# Patient Record
Sex: Female | Born: 1955 | Race: White | Hispanic: No | Marital: Single | Smoking: Current every day smoker
Health system: Southern US, Community
[De-identification: ages and names within clinical notes are randomized; demographics above are authoritative.]

## PROBLEM LIST (undated history)

## (undated) DIAGNOSIS — E039 Hypothyroidism, unspecified: Secondary | ICD-10-CM

## (undated) DIAGNOSIS — M1990 Unspecified osteoarthritis, unspecified site: Secondary | ICD-10-CM

## (undated) DIAGNOSIS — I10 Essential (primary) hypertension: Secondary | ICD-10-CM

## (undated) DIAGNOSIS — K219 Gastro-esophageal reflux disease without esophagitis: Secondary | ICD-10-CM

## (undated) DIAGNOSIS — E119 Type 2 diabetes mellitus without complications: Secondary | ICD-10-CM

## (undated) HISTORY — PX: ABDOMINAL HYSTERECTOMY: SHX81

## (undated) HISTORY — PX: APPENDECTOMY: SHX54

---

## 2007-06-18 ENCOUNTER — Ambulatory Visit: Payer: Self-pay | Admitting: Emergency Medicine

## 2007-08-27 ENCOUNTER — Ambulatory Visit: Payer: Self-pay | Admitting: Family Medicine

## 2008-06-29 ENCOUNTER — Ambulatory Visit: Payer: Self-pay

## 2009-07-26 ENCOUNTER — Ambulatory Visit: Payer: Self-pay | Admitting: Internal Medicine

## 2010-12-12 ENCOUNTER — Ambulatory Visit: Payer: Self-pay | Admitting: Obstetrics and Gynecology

## 2010-12-13 ENCOUNTER — Emergency Department: Payer: Self-pay | Admitting: Emergency Medicine

## 2010-12-26 ENCOUNTER — Ambulatory Visit: Payer: Self-pay | Admitting: Obstetrics and Gynecology

## 2010-12-26 ENCOUNTER — Ambulatory Visit: Payer: Self-pay | Admitting: Internal Medicine

## 2010-12-26 IMAGING — MG MAM DGTL ADD VW LT  SCR
1 series · 4 of 4 positions shown · non-contrast
Comparison: [DATE], [DATE] and [DATE].

REASON FOR EXAM: AV LT ASYMMETRIES
COMMENTS:

PROCEDURE:     MAM - MAM DGTL ADD VW LT  SCR  - [DATE]  [DATE]
RESULT:

[Series 3845: L ML · left · 4 of 4 slices shown]
[im 1/4]
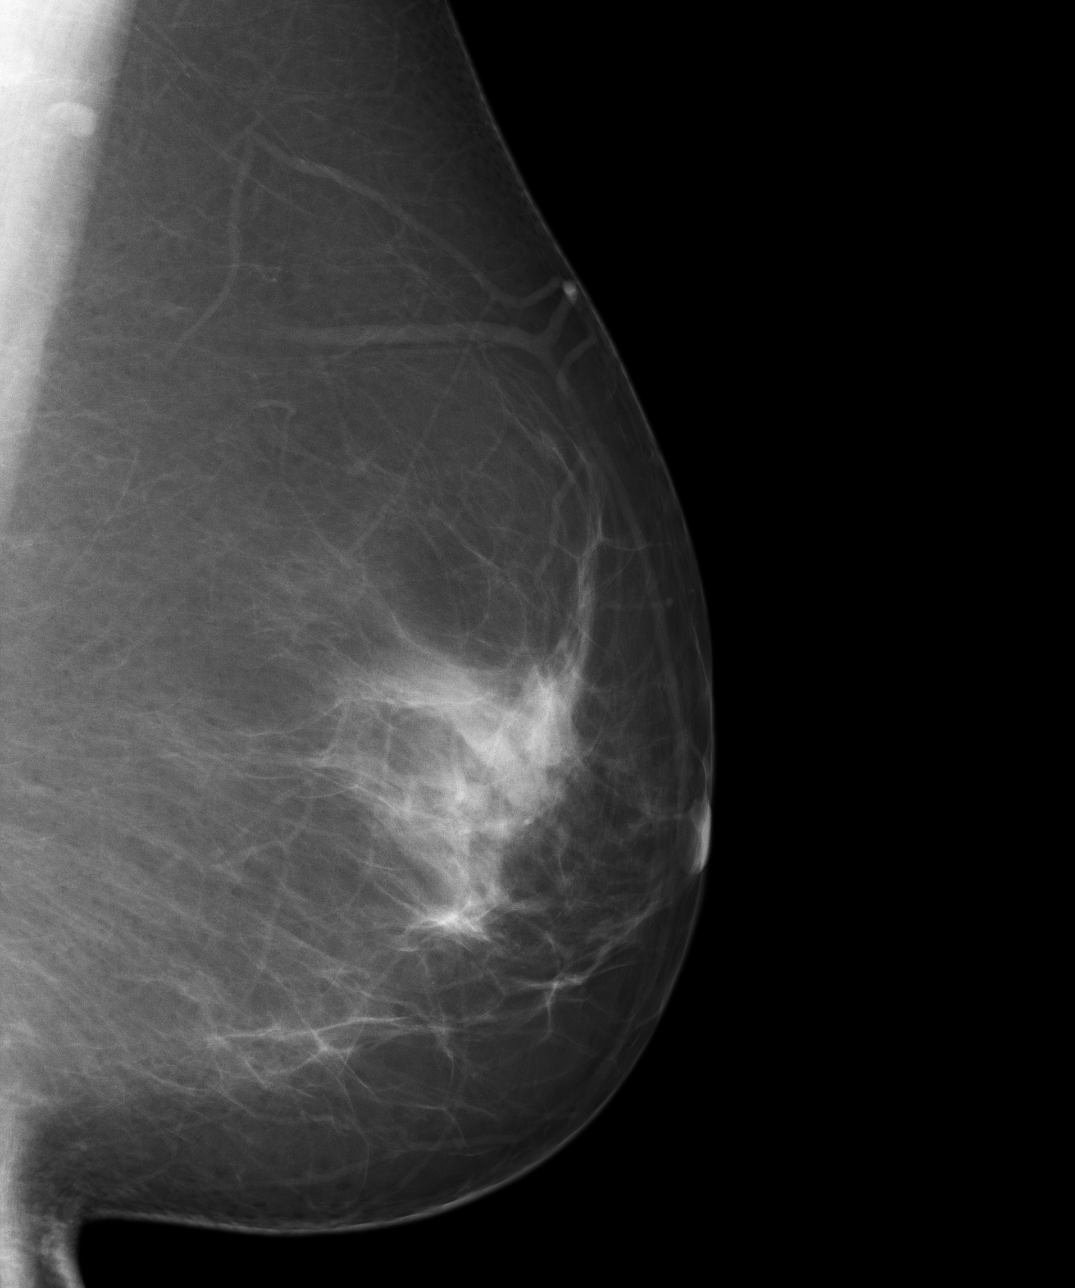
[im 2/4]
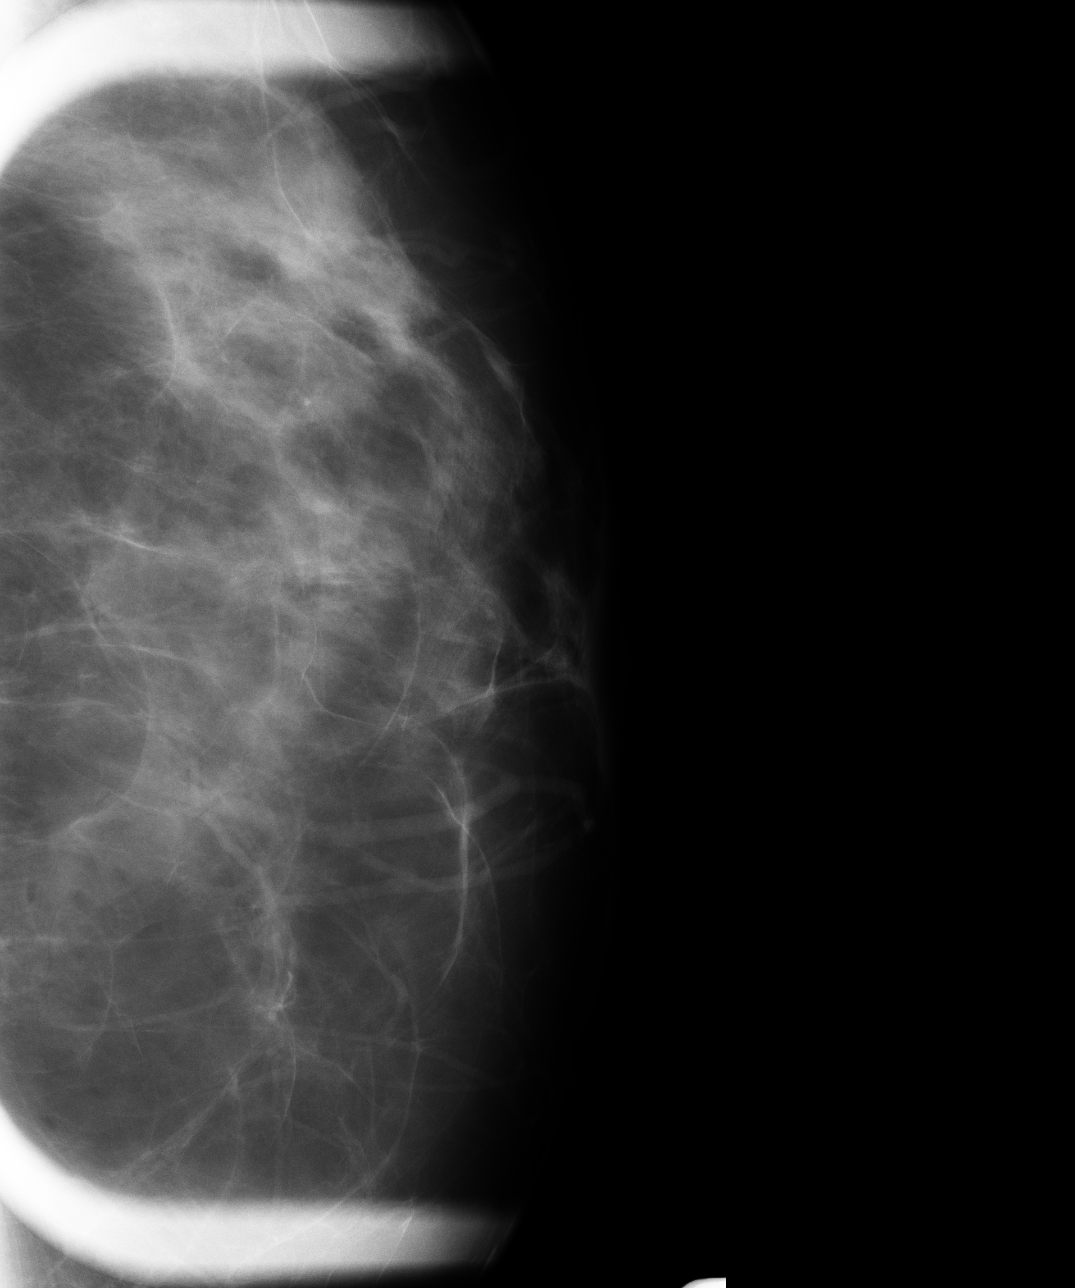
[im 3/4]
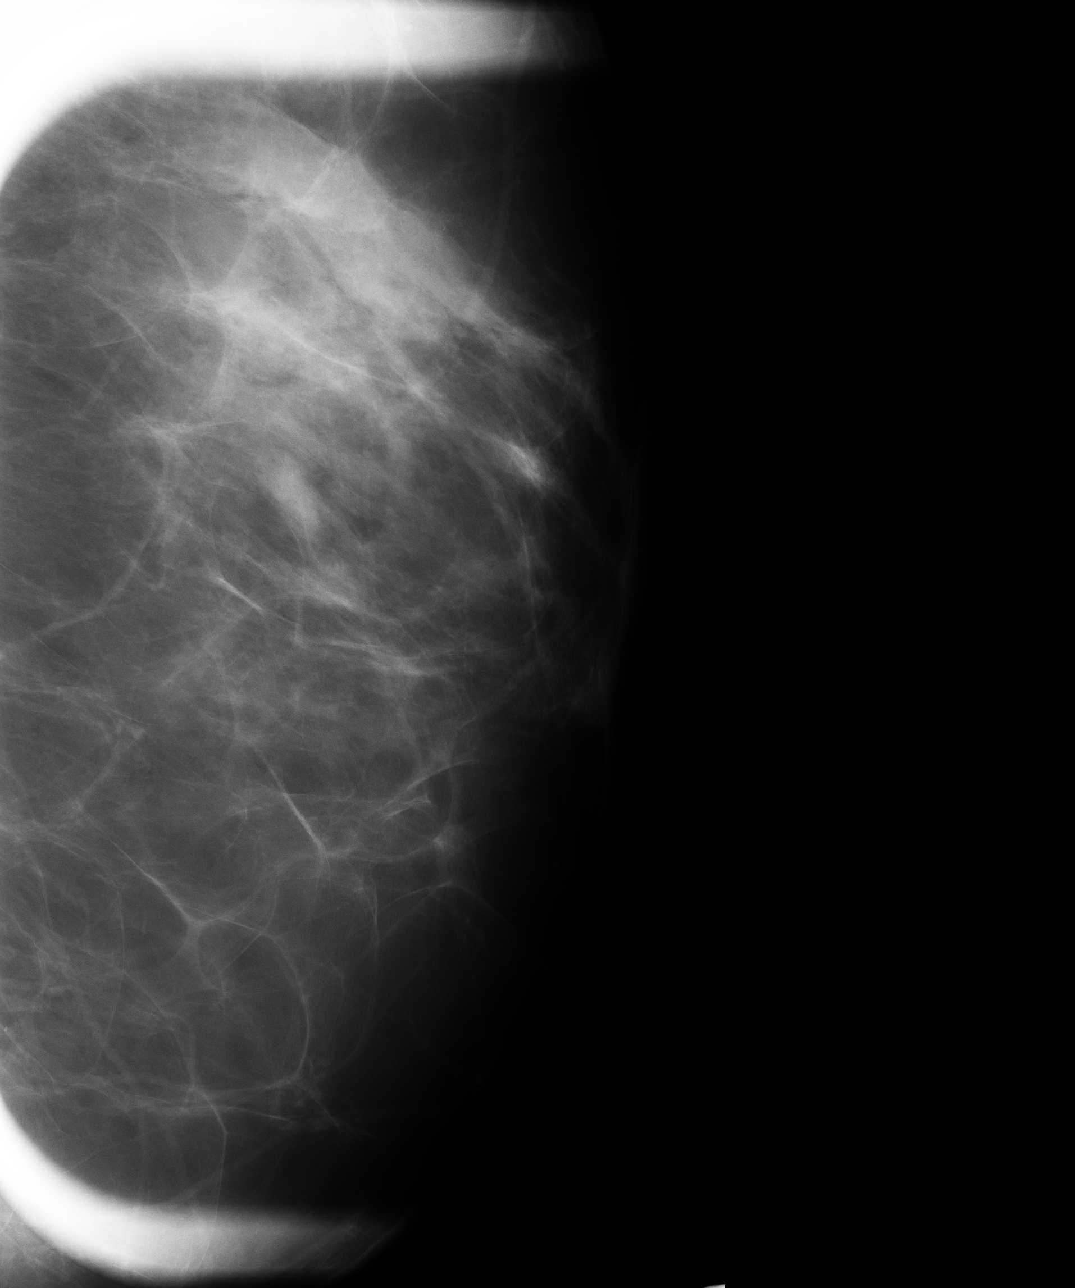
[im 4/4]
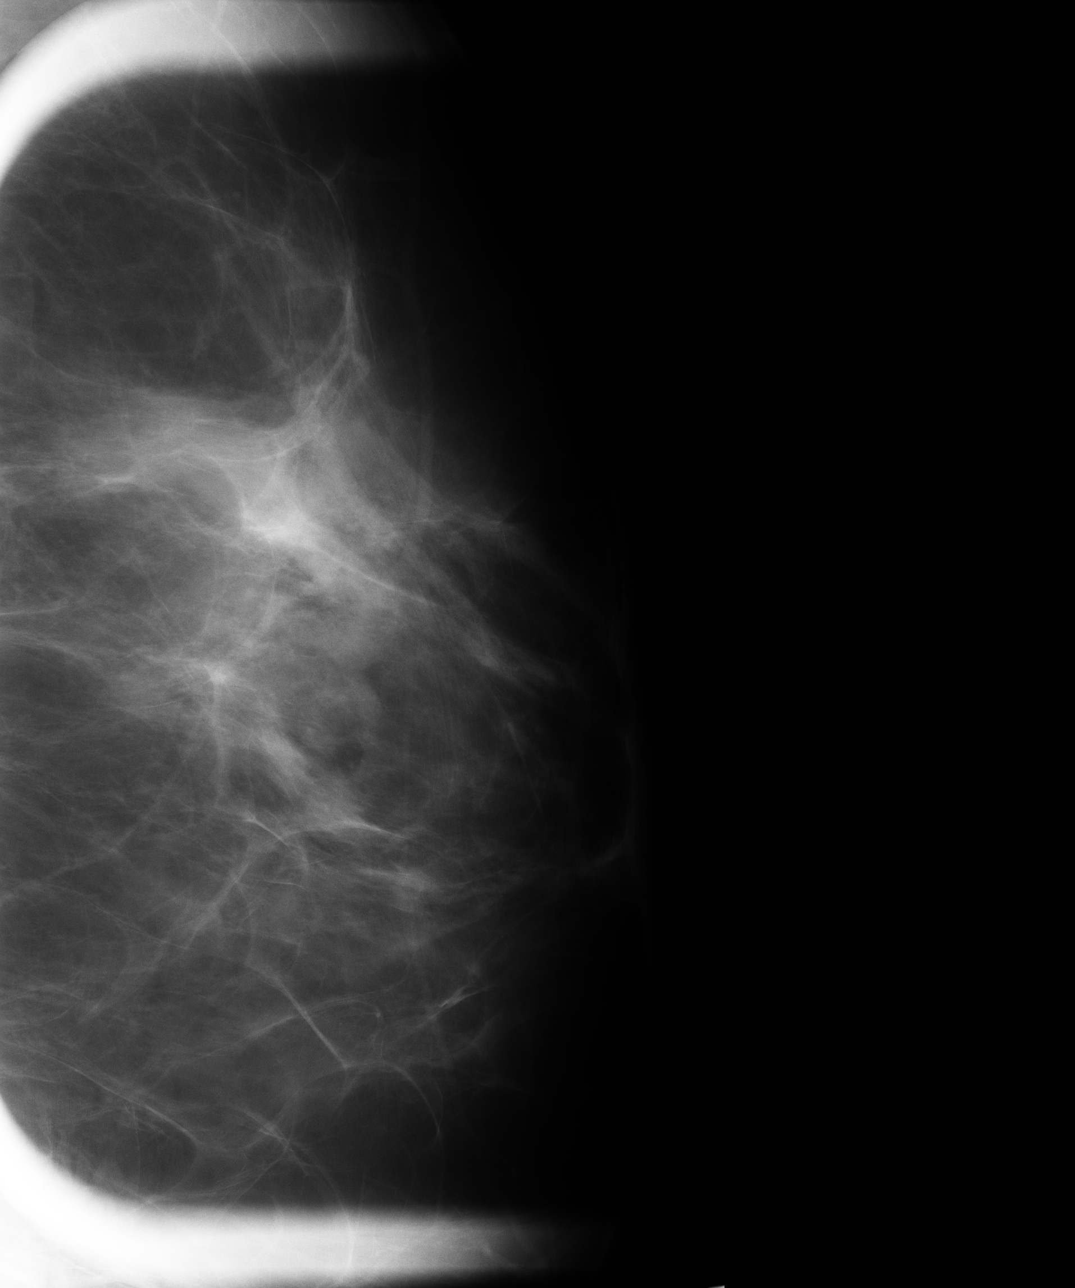

[4 of 4 positions shown; findings below may reference images not displayed]

FINDINGS: True lateral and spot compression magnification views were
performed to evaluate the asymmetries seen on the screening mammograms of
the left breast.  With these views, the asymmetries effaced and assumed the
appearance of normal fibroglandular tissue.
IMPRESSION: 1.     BI-RADS:  Category 2- Benign Finding.
2.     Continued annual screening mammography is recommended.

A negative mammogram report does not preclude biopsy or other evaluation of
a clinically palpable or otherwise suspicious mass or lesion. Breast cancer
may not be detected by mammography in up to 10% of cases.

## 2011-01-21 ENCOUNTER — Ambulatory Visit: Payer: Self-pay | Admitting: Internal Medicine

## 2011-02-21 ENCOUNTER — Ambulatory Visit: Payer: Self-pay | Admitting: Internal Medicine

## 2011-04-02 ENCOUNTER — Ambulatory Visit: Payer: Self-pay | Admitting: Gastroenterology

## 2012-02-13 ENCOUNTER — Ambulatory Visit: Payer: Self-pay | Admitting: Obstetrics and Gynecology

## 2012-09-15 ENCOUNTER — Ambulatory Visit: Payer: Self-pay | Admitting: Family Medicine

## 2012-09-24 ENCOUNTER — Ambulatory Visit: Payer: Self-pay | Admitting: Internal Medicine

## 2013-05-18 ENCOUNTER — Ambulatory Visit: Payer: Self-pay | Admitting: Obstetrics and Gynecology

## 2016-02-25 ENCOUNTER — Encounter: Payer: Self-pay | Admitting: Emergency Medicine

## 2016-02-25 ENCOUNTER — Ambulatory Visit
Admission: EM | Admit: 2016-02-25 | Discharge: 2016-02-25 | Disposition: A | Discharge status: Home / Self Care | Payer: BLUE CROSS/BLUE SHIELD | Attending: Family Medicine | Admitting: Family Medicine

## 2016-02-25 DIAGNOSIS — W57XXXA Bitten or stung by nonvenomous insect and other nonvenomous arthropods, initial encounter: Secondary | ICD-10-CM

## 2016-02-25 DIAGNOSIS — R21 Rash and other nonspecific skin eruption: Secondary | ICD-10-CM | POA: Diagnosis not present

## 2016-02-25 HISTORY — DX: Type 2 diabetes mellitus without complications: E11.9

## 2016-02-25 MED ORDER — PERMETHRIN 5 % EX CREA: TOPICAL_CREAM | CUTANEOUS | 1 refills | Status: DC

## 2016-02-25 MED ORDER — HYDROXYZINE HCL 25 MG PO TABS: 25.0000 mg | ORAL_TABLET | Freq: Three times a day (TID) | ORAL | 0 refills | Status: DC | PRN

## 2016-02-25 NOTE — ED Provider Notes (Signed)
[facility] ____________________________________________  Time seen: Approximately 11:13 AM  I have reviewed the triage vital signs and the nursing notes.   HISTORY  Chief Complaint Rash   HPI Denise Sanders is a 60 y.o. female presents with complaints of itchy bumps and states that she thinks she is bitten by insects. Patient reports this is been present since Monday. Patient reports this past weekend she visited out of town and stayed at a boyfriends house. Patient states he also has similar appearance of bug bites. Patient reports she first saw bites to her right arm and buttocks, and reports the itchy bumps has since spread. Patient reports that she does have dogs at home, denies any fleas. Denies any changes in foods, medicines, lotions, detergents or other contacts. Denies any other triggers. Patient reports this onset occurred after staying with her boyfriend.  Denies chest pain, shortness breath, abdominal pain, dysuria, fevers, other rash. Denies any history of renal insufficiency or cardiac history.  Past Medical History:  Diagnosis Date  . Diabetes mellitus without complication ([place])     There are no active problems to display for this patient.   History reviewed. No pertinent surgical history.  Current Outpatient Rx  . Order #: [id number] Class: Historical Med  . Order #: [id number] Class: Historical Med  . Order #: [id number] Class: Normal  . Order #: [id number] Class: Normal    No current facility-administered medications for this encounter.   Current Outpatient Prescriptions:  .  glipiZIDE (GLUCOTROL) 10 MG tablet, Take 10 mg by mouth daily before breakfast., Disp: , Rfl:  .  metFORMIN (GLUCOPHAGE) 500 MG tablet, Take 500 mg by mouth 2 (two) times daily with a meal., Disp: , Rfl:  .  hydrOXYzine (ATARAX/VISTARIL) 25 MG tablet, Take 1 tablet (25 mg total) by mouth 3 (three) times daily as needed for itching., Disp: 15 tablet, Rfl: 0 .  permethrin (ELIMITE)  5 % cream, Apply to entire body topically once and leave on for 8-14 hours then wash off. Repeat in 1 week. Rx: 2 doses, Disp: 60 g, Rfl: 1  Allergies Patient has no known allergies.  History reviewed. No pertinent family history.  Social History Social History  Substance Use Topics  . Smoking status: Current Every Day Smoker    Packs/day: 1.00    Types: Cigarettes  . Smokeless tobacco: Never Used  . Alcohol use Yes    Review of Systems Constitutional: No fever/chills Eyes: No visual changes. ENT: No sore throat. Cardiovascular: Denies chest pain. Respiratory: Denies shortness of breath. Gastrointestinal: No abdominal pain.  No nausea, no vomiting.  No diarrhea.  No constipation. Genitourinary: Negative for dysuria. Musculoskeletal: Negative for back pain. Skin: Positive for rash. Neurological: Negative for headaches, focal weakness or numbness.  10-point ROS otherwise negative.  ____________________________________________   PHYSICAL EXAM:  VITAL SIGNS: ED Triage Vitals  Enc Vitals Group     BP 02/25/16 1043 (!) 153/99     Pulse Rate 02/25/16 1043 80     Resp 02/25/16 1043 16     Temp 02/25/16 1043 97.2 F (36.2 C)     Temp Source 02/25/16 1043 Tympanic     SpO2 02/25/16 1043 100 %     Weight 02/25/16 1041 158 lb (71.7 kg)     Height 02/25/16 1041 5\' 4"  (1.626 m)     Head Circumference --      Peak Flow --      Pain Score 02/25/16 1043 0     Pain Loc --  Pain Edu? --      Excl. in [place]? --     Constitutional: Alert and oriented. Well appearing and in no acute distress. Eyes: Conjunctivae are normal. PERRL. EOMI. ENT      Head: Normocephalic and atraumatic.      Mouth/Throat: Mucous membranes are moist.Oropharynx non-erythematous. Cardiovascular: Normal rate, regular rhythm. Grossly normal heart sounds.  Good peripheral circulation. Respiratory: Normal respiratory effort without tachypnea nor retractions. Breath sounds are clear and equal bilaterally.  No wheezes/rales/rhonchi.. Gastrointestinal: Soft and nontender.  Musculoskeletal:  Nontender with normal range of motion in all extremities. No midline cervical, thoracic or lumbar tenderness to palpation.  Neurologic:  Normal speech and language. No gross focal neurologic deficits are appreciated. Speech is normal. No gait instability.  Skin:  Skin is warm, dry and intact. Except: Patient with minimally erythematous slightly excoriated small papules with centered punctum's to right lateral arm, scattered anterior abdomen, lower back and posterior left leg, areas tend to be clustered together, no surrounding erythema, no induration, no fluctuance, no drainage, nontender, areas are pruritic. No burrows or tunnels noted. No rash present in hands or feet. Psychiatric: Mood and affect are normal. Speech and behavior are normal. Patient exhibits appropriate insight and judgment   ___________________________________________   LABS (all labs ordered are listed, but only abnormal results are displayed)  Labs Reviewed - No data to display ____________________________________________   PROCEDURES Procedures    INITIAL IMPRESSION / ASSESSMENT AND PLAN / ED COURSE  Pertinent labs & imaging results that were available during my care of the patient were reviewed by me and considered in my medical decision making (see chart for details).  Very well-appearing patient. No acute distress. Discussed in detail with patient onset and appearance suggestive of bed bug bites. Also discussed concern for scabies. Will treat patient with oral hydroxyzine as needed for itching as well as topical permethrin cream. Encouraged patient in regards to monitoring and trasmission. Counseled avoidance of scratching. Discussed indication, risks and benefits of medications with patient.  Discussed follow up with Primary care physician this week. Discussed follow up and return parameters including no resolution or any worsening  concerns. Patient verbalized understanding and agreed to plan.   ____________________________________________   FINAL CLINICAL IMPRESSION(S) / ED DIAGNOSES  Final diagnoses:  Rash  Insect bite, initial encounter     Discharge Medication List as of 02/25/2016 11:34 AM    START taking these medications   Details  hydrOXYzine (ATARAX/VISTARIL) 25 MG tablet Take 1 tablet (25 mg total) by mouth 3 (three) times daily as needed for itching., Starting Sun 02/25/2016, Normal    permethrin (ELIMITE) 5 % cream Apply to entire body topically once and leave on for 8-14 hours then wash off. Repeat in 1 week. Rx: 2 doses, Normal        Note: This dictation was prepared with Dragon dictation along with smaller phrase technology. Any transcriptional errors that result from this process are unintentional.    Clinical Course       Marylene Land, NP 02/25/16 1500    Marylene Land, NP 02/25/16 1501

## 2016-02-25 NOTE — Discharge Instructions (Signed)
Take medication as prescribed. Rest. Drink plenty of fluids. Avoid scratching.   Follow up with your primary care physician this week as needed. Return to Urgent care for new or worsening concerns.   

## 2016-02-25 NOTE — ED Triage Notes (Signed)
Patient c/o itchy bumps all over since Monday.

## 2016-02-28 ENCOUNTER — Telehealth: Payer: Self-pay | Admitting: *Deleted

## 2016-04-22 HISTORY — PX: TRACHEOSTOMY: SUR1362

## 2016-06-11 ENCOUNTER — Ambulatory Visit
Admission: EM | Admit: 2016-06-11 | Discharge: 2016-06-11 | Disposition: A | Discharge status: Home / Self Care | Payer: BLUE CROSS/BLUE SHIELD | Attending: Family Medicine | Admitting: Family Medicine

## 2016-06-11 ENCOUNTER — Encounter: Payer: Self-pay | Admitting: Emergency Medicine

## 2016-06-11 DIAGNOSIS — R05 Cough: Secondary | ICD-10-CM | POA: Diagnosis not present

## 2016-06-11 DIAGNOSIS — J0100 Acute maxillary sinusitis, unspecified: Secondary | ICD-10-CM | POA: Diagnosis not present

## 2016-06-11 DIAGNOSIS — R059 Cough, unspecified: Secondary | ICD-10-CM

## 2016-06-11 MED ORDER — AZITHROMYCIN 250 MG PO TABS: ORAL_TABLET | ORAL | 0 refills | Status: DC

## 2016-06-11 MED ORDER — BENZONATATE 100 MG PO CAPS: 100.0000 mg | ORAL_CAPSULE | Freq: Three times a day (TID) | ORAL | 0 refills | Status: DC | PRN

## 2016-06-11 NOTE — ED Provider Notes (Signed)
[facility]    CSN: [id number] Arrival date & time: 06/11/16  1423     History   Chief Complaint Chief Complaint  Patient presents with  . Cough    HPI Denise Sanders is a 61 y.o. female.    Cough  Associated symptoms: no wheezing   URI  Presenting symptoms: congestion, cough, facial pain and fatigue   Severity:  Moderate Onset quality:  Sudden Duration:  5 days Timing:  Constant Progression:  Worsening Chronicity:  New Relieved by:  Nothing Ineffective treatments:  OTC medications Associated symptoms: sinus pain   Associated symptoms: no wheezing   Risk factors: sick contacts   Risk factors: not elderly, no chronic cardiac disease, no chronic kidney disease, no diabetes mellitus, no immunosuppression, no recent illness and no recent travel   Risk factors comment:  Chronic smoker   Past Medical History:  Diagnosis Date  . Diabetes mellitus without complication ([place])     There are no active problems to display for this patient.   Past Surgical History:  Procedure Laterality Date  . APPENDECTOMY      OB History    No data available       Home Medications    Prior to Admission medications   Medication Sig Start Date End Date Taking? Authorizing Provider  gabapentin (NEURONTIN) 100 MG capsule Take 100 mg by mouth at bedtime.   Yes Historical Provider, MD  traMADol (ULTRAM) 50 MG tablet Take 50 mg by mouth every 6 (six) hours as needed.   Yes Historical Provider, MD  azithromycin (ZITHROMAX Z-PAK) 250 MG tablet 2 tabs po once day 1, then 1 tab po qd for next 4 days 06/11/16   Norval Gable, MD  benzonatate (TESSALON) 100 MG capsule Take 1 capsule (100 mg total) by mouth 3 (three) times daily as needed. 06/11/16   Norval Gable, MD  glipiZIDE (GLUCOTROL) 10 MG tablet Take 10 mg by mouth daily before breakfast.    Historical Provider, MD  hydrOXYzine (ATARAX/VISTARIL) 25 MG tablet Take 1 tablet (25 mg total) by mouth 3 (three) times daily as needed  for itching. 02/25/16   Marylene Land, NP  metFORMIN (GLUCOPHAGE) 500 MG tablet Take 500 mg by mouth 2 (two) times daily with a meal.    Historical Provider, MD    Family History History reviewed. No pertinent family history.  Social History Social History  Substance Use Topics  . Smoking status: Current Every Day Smoker    Packs/day: 1.00    Types: Cigarettes  . Smokeless tobacco: Never Used  . Alcohol use Yes     Allergies   Patient has no known allergies.   Review of Systems Review of Systems  Constitutional: Positive for fatigue.  HENT: Positive for congestion and sinus pain.   Respiratory: Positive for cough. Negative for wheezing.      Physical Exam Triage Vital Signs ED Triage Vitals  Enc Vitals Group     BP 06/11/16 1608 (!) 149/80     Pulse Rate 06/11/16 1608 79     Resp 06/11/16 1608 16     Temp 06/11/16 1608 98.6 F (37 C)     Temp Source 06/11/16 1608 Oral     SpO2 06/11/16 1608 100 %     Weight 06/11/16 1605 156 lb (70.8 kg)     Height 06/11/16 1605 5\' 4"  (1.626 m)     Head Circumference --      Peak Flow --  Pain Score 06/11/16 1606 0     Pain Loc --      Pain Edu? --      Excl. in [place]? --    No data found.   Updated Vital Signs BP (!) 149/80 (BP Location: Left Arm)   Pulse 79   Temp 98.6 F (37 C) (Oral)   Resp 16   Ht 5\' 4"  (1.626 m)   Wt 156 lb (70.8 kg)   SpO2 100%   BMI 26.78 kg/m   Visual Acuity Right Eye Distance:   Left Eye Distance:   Bilateral Distance:    Right Eye Near:   Left Eye Near:    Bilateral Near:     Physical Exam  Constitutional: She appears well-developed and well-nourished. No distress.  HENT:  Head: Normocephalic and atraumatic.  Right Ear: Tympanic membrane, external ear and ear canal normal.  Left Ear: Tympanic membrane, external ear and ear canal normal.  Nose: Mucosal edema and rhinorrhea present. No nose lacerations, sinus tenderness, nasal deformity, septal deviation or nasal septal  hematoma. No epistaxis.  No foreign bodies. Right sinus exhibits maxillary sinus tenderness and frontal sinus tenderness. Left sinus exhibits maxillary sinus tenderness and frontal sinus tenderness.  Mouth/Throat: Uvula is midline, oropharynx is clear and moist and mucous membranes are normal. No oropharyngeal exudate.  Eyes: Conjunctivae and EOM are normal. Pupils are equal, round, and reactive to light. Right eye exhibits no discharge. Left eye exhibits no discharge. No scleral icterus.  Neck: Normal range of motion. Neck supple. No thyromegaly present.  Cardiovascular: Normal rate, regular rhythm and normal heart sounds.   Pulmonary/Chest: Effort normal and breath sounds normal. No respiratory distress. She has no wheezes. She has no rales.  Lymphadenopathy:    She has no cervical adenopathy.  Skin: She is not diaphoretic.  Nursing note and vitals reviewed.    UC Treatments / Results  Labs (all labs ordered are listed, but only abnormal results are displayed) Labs Reviewed - No data to display  EKG  EKG Interpretation None       Radiology No results found.  Procedures Procedures (including critical care time)  Medications Ordered in UC Medications - No data to display   Initial Impression / Assessment and Plan / UC Course  I have reviewed the triage vital signs and the nursing notes.  Pertinent labs & imaging results that were available during my care of the patient were reviewed by me and considered in my medical decision making (see chart for details).       Final Clinical Impressions(s) / UC Diagnoses   Final diagnoses:  Acute maxillary sinusitis, recurrence not specified  Cough    New Prescriptions Discharge Medication List as of 06/11/2016  4:30 PM    START taking these medications   Details  azithromycin (ZITHROMAX Z-PAK) 250 MG tablet 2 tabs po once day 1, then 1 tab po qd for next 4 days, Normal    benzonatate (TESSALON) 100 MG capsule Take 1  capsule (100 mg total) by mouth 3 (three) times daily as needed., Starting Tue 06/11/2016, Normal       1. diagnosis reviewed with patient 2. rx as per orders above; reviewed possible side effects, interactions, risks and benefits  3. Recommend supportive treatment with otc flonase  4. Follow-up prn if symptoms worsen or don't improve   Norval Gable, MD 06/11/16 [phone]

## 2016-06-11 NOTE — ED Triage Notes (Signed)
Patient c/o cough, chest congestion, bodyaches, and HAs since Friday.

## 2016-07-21 ENCOUNTER — Encounter: Payer: Self-pay | Admitting: Gynecology

## 2016-07-21 ENCOUNTER — Ambulatory Visit
Admission: EM | Admit: 2016-07-21 | Discharge: 2016-07-21 | Disposition: A | Discharge status: Home / Self Care | Payer: BLUE CROSS/BLUE SHIELD | Attending: Family Medicine | Admitting: Family Medicine

## 2016-07-21 DIAGNOSIS — J0100 Acute maxillary sinusitis, unspecified: Secondary | ICD-10-CM | POA: Diagnosis not present

## 2016-07-21 DIAGNOSIS — J42 Unspecified chronic bronchitis: Secondary | ICD-10-CM

## 2016-07-21 DIAGNOSIS — J209 Acute bronchitis, unspecified: Secondary | ICD-10-CM | POA: Diagnosis not present

## 2016-07-21 MED ORDER — AMOXICILLIN-POT CLAVULANATE 875-125 MG PO TABS: 1.0000 | ORAL_TABLET | Freq: Two times a day (BID) | ORAL | 0 refills | Status: DC

## 2016-07-21 NOTE — ED Triage Notes (Signed)
Per patient cough. Head congestion/ chest congestion x 1 week. Per patient recurrent from last visit.

## 2016-07-21 NOTE — ED Provider Notes (Signed)
[facility]    CSN: [id number] Arrival date & time: 07/21/16  0904     History   Chief Complaint Chief Complaint  Patient presents with  . Cough    HPI Denise Sanders is a 61 y.o. female.   The history is provided by the patient.  Cough  Associated symptoms: no wheezing   URI  Presenting symptoms: congestion, cough and facial pain   Severity:  Moderate Onset quality:  Sudden Duration:  1 week Timing:  Constant Progression:  Worsening Chronicity:  New Relieved by:  None tried Ineffective treatments:  None tried Associated symptoms: sinus pain   Associated symptoms: no wheezing   Risk factors: chronic respiratory disease   Risk factors: not elderly, no chronic cardiac disease, no chronic kidney disease, no diabetes mellitus, no immunosuppression, no recent illness, no recent travel and no sick contacts     Past Medical History:  Diagnosis Date  . Diabetes mellitus without complication ([place])     There are no active problems to display for this patient.   Past Surgical History:  Procedure Laterality Date  . APPENDECTOMY      OB History    No data available       Home Medications    Prior to Admission medications   Medication Sig Start Date End Date Taking? Authorizing Provider  gabapentin (NEURONTIN) 100 MG capsule Take 100 mg by mouth at bedtime.   Yes Historical Provider, MD  glipiZIDE (GLUCOTROL) 10 MG tablet Take 10 mg by mouth daily before breakfast.   Yes Historical Provider, MD  hydrOXYzine (ATARAX/VISTARIL) 25 MG tablet Take 1 tablet (25 mg total) by mouth 3 (three) times daily as needed for itching. 02/25/16  Yes Marylene Land, NP  metFORMIN (GLUCOPHAGE) 500 MG tablet Take 500 mg by mouth 2 (two) times daily with a meal.   Yes Historical Provider, MD  traMADol (ULTRAM) 50 MG tablet Take 50 mg by mouth every 6 (six) hours as needed.   Yes Historical Provider, MD  amoxicillin-clavulanate (AUGMENTIN) 875-125 MG tablet Take 1 tablet by  mouth 2 (two) times daily. 07/21/16   Norval Gable, MD  azithromycin (ZITHROMAX Z-PAK) 250 MG tablet 2 tabs po once day 1, then 1 tab po qd for next 4 days 06/11/16   Norval Gable, MD  benzonatate (TESSALON) 100 MG capsule Take 1 capsule (100 mg total) by mouth 3 (three) times daily as needed. 06/11/16   Norval Gable, MD    Family History No family history on file.  Social History Social History  Substance Use Topics  . Smoking status: Current Every Day Smoker    Packs/day: 1.00    Types: Cigarettes  . Smokeless tobacco: Never Used  . Alcohol use Yes     Allergies   Patient has no known allergies.   Review of Systems Review of Systems  HENT: Positive for congestion and sinus pain.   Respiratory: Positive for cough. Negative for wheezing.      Physical Exam Triage Vital Signs ED Triage Vitals  Enc Vitals Group     BP 07/21/16 0915 (!) 142/94     Pulse Rate 07/21/16 0915 88     Resp 07/21/16 0915 16     Temp 07/21/16 0915 98.1 F (36.7 C)     Temp Source 07/21/16 0915 Oral     SpO2 07/21/16 0915 99 %     Weight 07/21/16 0917 156 lb (70.8 kg)     Height --  Head Circumference --      Peak Flow --      Pain Score 07/21/16 0917 7     Pain Loc --      Pain Edu? --      Excl. in [place]? --    No data found.   Updated Vital Signs BP (!) 142/94 (BP Location: Left Arm)   Pulse 88   Temp 98.1 F (36.7 C) (Oral)   Resp 16   Wt 156 lb (70.8 kg)   SpO2 99%   BMI 26.78 kg/m   Visual Acuity Right Eye Distance:   Left Eye Distance:   Bilateral Distance:    Right Eye Near:   Left Eye Near:    Bilateral Near:     Physical Exam  Constitutional: She appears well-developed and well-nourished. No distress.  HENT:  Head: Normocephalic and atraumatic.  Right Ear: Tympanic membrane, external ear and ear canal normal.  Left Ear: Tympanic membrane, external ear and ear canal normal.  Nose: Mucosal edema and rhinorrhea present. No nose lacerations, sinus tenderness,  nasal deformity, septal deviation or nasal septal hematoma. No epistaxis.  No foreign bodies. Right sinus exhibits maxillary sinus tenderness and frontal sinus tenderness. Left sinus exhibits maxillary sinus tenderness and frontal sinus tenderness.  Mouth/Throat: Uvula is midline, oropharynx is clear and moist and mucous membranes are normal. No oropharyngeal exudate.  Eyes: Conjunctivae and EOM are normal. Pupils are equal, round, and reactive to light. Right eye exhibits no discharge. Left eye exhibits no discharge. No scleral icterus.  Neck: Normal range of motion. Neck supple. No thyromegaly present.  Cardiovascular: Normal rate, regular rhythm and normal heart sounds.   Pulmonary/Chest: Effort normal. No respiratory distress. She has no wheezes. She has no rales.  Diffuse rhonchi  Lymphadenopathy:    She has no cervical adenopathy.  Skin: She is not diaphoretic.  Nursing note and vitals reviewed.    UC Treatments / Results  Labs (all labs ordered are listed, but only abnormal results are displayed) Labs Reviewed - No data to display  EKG  EKG Interpretation None       Radiology No results found.  Procedures Procedures (including critical care time)  Medications Ordered in UC Medications - No data to display   Initial Impression / Assessment and Plan / UC Course  I have reviewed the triage vital signs and the nursing notes.  Pertinent labs & imaging results that were available during my care of the patient were reviewed by me and considered in my medical decision making (see chart for details).       Final Clinical Impressions(s) / UC Diagnoses   Final diagnoses:  Chronic bronchitis with acute exacerbation (HCC)  Acute maxillary sinusitis, recurrence not specified    New Prescriptions Discharge Medication List as of 07/21/2016  9:35 AM    START taking these medications   Details  amoxicillin-clavulanate (AUGMENTIN) 875-125 MG tablet Take 1 tablet by mouth 2  (two) times daily., Starting Sun 07/21/2016, Normal       1. diagnosis reviewed with patient 2. rx as per orders above; reviewed possible side effects, interactions, risks and benefits  3. Recommend supportive treatment with rest, fluids, otc flonase  4. Follow-up prn if symptoms worsen or don't improve   Norval Gable, MD 07/21/16 1059

## 2016-08-02 ENCOUNTER — Other Ambulatory Visit: Payer: Self-pay | Admitting: Obstetrics and Gynecology

## 2016-08-02 DIAGNOSIS — Z1231 Encounter for screening mammogram for malignant neoplasm of breast: Secondary | ICD-10-CM

## 2016-08-26 ENCOUNTER — Ambulatory Visit: Payer: BLUE CROSS/BLUE SHIELD | Attending: Obstetrics and Gynecology

## 2017-02-17 ENCOUNTER — Emergency Department
Admission: EM | Admit: 2017-02-17 | Discharge: 2017-02-17 | Disposition: A | Discharge status: Home / Self Care | Payer: BLUE CROSS/BLUE SHIELD | Attending: Emergency Medicine | Admitting: Emergency Medicine

## 2017-02-17 DIAGNOSIS — R531 Weakness: Secondary | ICD-10-CM | POA: Insufficient documentation

## 2017-02-17 DIAGNOSIS — R509 Fever, unspecified: Secondary | ICD-10-CM | POA: Insufficient documentation

## 2017-02-17 DIAGNOSIS — Z7984 Long term (current) use of oral hypoglycemic drugs: Secondary | ICD-10-CM | POA: Insufficient documentation

## 2017-02-17 DIAGNOSIS — B349 Viral infection, unspecified: Secondary | ICD-10-CM | POA: Insufficient documentation

## 2017-02-17 DIAGNOSIS — F17210 Nicotine dependence, cigarettes, uncomplicated: Secondary | ICD-10-CM | POA: Insufficient documentation

## 2017-02-17 DIAGNOSIS — E119 Type 2 diabetes mellitus without complications: Secondary | ICD-10-CM | POA: Insufficient documentation

## 2017-02-17 DIAGNOSIS — J029 Acute pharyngitis, unspecified: Secondary | ICD-10-CM | POA: Insufficient documentation

## 2017-02-17 DIAGNOSIS — Z79899 Other long term (current) drug therapy: Secondary | ICD-10-CM | POA: Insufficient documentation

## 2017-02-17 LAB — INFLUENZA PANEL BY PCR (TYPE A & B)
Influenza A By PCR: NEGATIVE
Influenza B By PCR: NEGATIVE

## 2017-02-17 LAB — POCT RAPID STREP A: Streptococcus, Group A Screen (Direct): NEGATIVE

## 2017-02-17 MED ORDER — ACETAMINOPHEN 500 MG PO TABS
1000.0000 mg | ORAL_TABLET | Freq: Once | ORAL | Status: AC
  Administered 2017-02-17: 1000 mg via ORAL
  Filled 2017-02-17: qty 2

## 2017-02-17 NOTE — ED Triage Notes (Signed)
Pt states woke up this morning with extreme pain in right ear that is radiating down into neck, along with throat pain. Pt also states feels weaker than normal, "I cant eat because I cant swallow"

## 2017-02-17 NOTE — ED Provider Notes (Signed)
[facility] Emergency Department Provider Note  ___________________________________________   First MD Initiated Contact with Patient 02/17/17 1302     (approximate)  I have reviewed the triage vital signs and the nursing notes.   HISTORY  Chief Complaint Otalgia and Neck Pain   HPI Denise Sanders is a 61 y.o. female he is here with complaint of right ear  pain and throat pain with sudden onset of body aches and fever this morning. Patient states she feels weak all over. She denies any coughing or congestion. She denies any urinary symptoms. She has not taken any over-the-counter medication for her fever. Patient attended a wedding over the weekend in which she was exposed to people from both [place] and [place].  She is unaware of any family members having the flu. She rates her pain as 10 over 10.  Past Medical History:  Diagnosis Date  . Diabetes mellitus without complication ([place])     There are no active problems to display for this patient.   Past Surgical History:  Procedure Laterality Date  . APPENDECTOMY      Prior to Admission medications   Medication Sig Start Date End Date Taking? Authorizing Provider  gabapentin (NEURONTIN) 100 MG capsule Take 100 mg by mouth at bedtime.    [provider]  glipiZIDE (GLUCOTROL) 10 MG tablet Take 10 mg by mouth daily before breakfast.    [provider]  hydrOXYzine (ATARAX/VISTARIL) 25 MG tablet Take 1 tablet (25 mg total) by mouth 3 (three) times daily as needed for itching. 02/25/16   Marylene Land, NP  metFORMIN (GLUCOPHAGE) 500 MG tablet Take 500 mg by mouth 2 (two) times daily with a meal.    [provider]  traMADol (ULTRAM) 50 MG tablet Take 50 mg by mouth every 6 (six) hours as needed.    [provider]    Allergies Patient has no known allergies.  History reviewed. No pertinent family history.  Social History Social History  Substance Use Topics  .  Smoking status: Current Every Day Smoker    Packs/day: 1.00    Types: Cigarettes  . Smokeless tobacco: Never Used  . Alcohol use Yes    Review of Systems Constitutional: positive fever/chills Eyes: No visual changes. ENT: positive sore throat. Positive right ear pain. Cardiovascular: Denies chest pain. Respiratory: Denies shortness of breath. Gastrointestinal: No abdominal pain.  No nausea, no vomiting.  No diarrhea.  Genitourinary: Negative for dysuria. Musculoskeletal: positive for generalized muscle aches. Skin: Negative for rash. Neurological: Negative for headaches, focal weakness or numbness. ___________________________________________   PHYSICAL EXAM:  VITAL SIGNS: ED Triage Vitals  Enc Vitals Group     BP 02/17/17 1240 (!) 149/82     Pulse Rate 02/17/17 1240 (!) 111     Resp 02/17/17 1240 18     Temp 02/17/17 1240 (!) 102.9 F (39.4 C)     Temp Source 02/17/17 1240 Oral     SpO2 02/17/17 1240 99 %     Weight 02/17/17 1235 160 lb (72.6 kg)     Height 02/17/17 1235 5\' 4"  (1.626 m)     Head Circumference --      Peak Flow --      Pain Score 02/17/17 1234 10     Pain Loc --      Pain Edu? --      Excl. in [place]? --     Constitutional: Alert and oriented. Well appearing and in no acute distress.  Eyes: Conjunctivae are normal.  Head: Atraumatic. Nose: No congestion/rhinnorhea.  EACs and TMs are clear bilaterally. Mouth/Throat: Mucous membranes are moist.  Oropharynx with minimal erythema. No tonsillar enlargement or exudate was seen. Uvula is midline. Posterior drainage noted. Neck: No stridor.   Hematological/Lymphatic/Immunilogical: tender cervical soft tissue but no appreciated lymph nodes. Cardiovascular: Normal rate, regular rhythm. Grossly normal heart sounds.  Good peripheral circulation. Respiratory: Normal respiratory effort.  No retractions. Lungs CTAB. Gastrointestinal: Soft and nontender. No distention.  No CVA tenderness. Musculoskeletal: No lower  extremity tenderness nor edema.  No joint effusions. Neurologic:  Normal speech and language. No gross focal neurologic deficits are appreciated. No gait instability. Skin:  Skin is warm, dry and intact. No rash noted. Psychiatric: Mood and affect are normal. Speech and behavior are normal.  ____________________________________________   LABS (all labs ordered are listed, but only abnormal results are displayed)  Labs Reviewed  INFLUENZA PANEL BY PCR (TYPE A & B)  POCT RAPID STREP A     PROCEDURES  Procedure(s) performed: None  Procedures  Critical Care performed: No  ____________________________________________   INITIAL IMPRESSION / ASSESSMENT AND PLAN / ED COURSE  Strep test and influenza were both negative. Patient is encouraged follow-up with her PCP if any continued problems. She is to increase fluids. Tylenol or ibuprofen as needed for fever or throat pain. She is to begin taking Zyrtec, Claritin or Sudafed PE as needed for congestion and saline nose spray.  Patient is encouraged to return if any severe worsening of her symptoms.  ___________________________________________   FINAL CLINICAL IMPRESSION(S) / ED DIAGNOSES  Final diagnoses:  Viral illness      NEW MEDICATIONS STARTED DURING THIS VISIT:  Discharge Medication List as of 02/17/2017  2:44 PM       Note:  This document was prepared using Dragon voice recognition software and may include unintentional dictation errors.    Johnn Hai, PA-C 02/17/17 1655    Nena Polio, MD 03/01/17 [phone]

## 2017-02-17 NOTE — Discharge Instructions (Signed)
Follow-up with your primary care provider if any continued problems or medical clinic. Increase fluids. Tylenol or ibuprofen as needed for fever or throat pain. Take Zyrtec, Claritin,or Sudafed PE as needed for congestion. You may also use saline nose spray.

## 2017-02-17 NOTE — ED Notes (Signed)
First Nurse: pt c/o sinus and ear pain.

## 2017-08-20 ENCOUNTER — Other Ambulatory Visit: Payer: Self-pay | Admitting: Obstetrics and Gynecology

## 2017-08-20 DIAGNOSIS — Z1231 Encounter for screening mammogram for malignant neoplasm of breast: Secondary | ICD-10-CM

## 2017-08-22 ENCOUNTER — Other Ambulatory Visit: Payer: Self-pay

## 2017-08-22 ENCOUNTER — Encounter: Payer: Self-pay | Admitting: Gynecology

## 2017-08-22 ENCOUNTER — Ambulatory Visit
Admission: EM | Admit: 2017-08-22 | Discharge: 2017-08-22 | Disposition: A | Discharge status: Home / Self Care | Payer: BLUE CROSS/BLUE SHIELD | Attending: Family Medicine | Admitting: Family Medicine

## 2017-08-22 DIAGNOSIS — J0100 Acute maxillary sinusitis, unspecified: Secondary | ICD-10-CM | POA: Diagnosis not present

## 2017-08-22 DIAGNOSIS — J029 Acute pharyngitis, unspecified: Secondary | ICD-10-CM

## 2017-08-22 LAB — RAPID STREP SCREEN (MED CTR MEBANE ONLY): Streptococcus, Group A Screen (Direct): NEGATIVE

## 2017-08-22 MED ORDER — AMOXICILLIN 875 MG PO TABS: 875.0000 mg | ORAL_TABLET | Freq: Two times a day (BID) | ORAL | 0 refills | Status: DC

## 2017-08-22 NOTE — ED Triage Notes (Signed)
Patient c/o sore throat x 1 week. Per patient painful swallow.

## 2017-08-22 NOTE — Discharge Instructions (Signed)
Tylenol or ibuprofen as needed Salt water gargles Flonase steroid nasal spray

## 2017-08-22 NOTE — ED Provider Notes (Signed)
[facility]    CSN: [id number] Arrival date & time: 08/22/17  0906     History   Chief Complaint Chief Complaint  Patient presents with  . Sore Throat    HPI Denise Sanders is a 62 y.o. female.   The history is provided by the patient.  URI  Presenting symptoms: congestion, facial pain and sore throat   Severity:  Moderate Onset quality:  Sudden Duration:  2 weeks Timing:  Constant Progression:  Worsening Chronicity:  New Relieved by:  Nothing Ineffective treatments:  OTC medications Associated symptoms: sinus pain   Associated symptoms: no headaches and no wheezing   Risk factors: diabetes mellitus and sick contacts   Risk factors: not elderly, no chronic cardiac disease, no chronic kidney disease, no immunosuppression, no recent illness and no recent travel     Past Medical History:  Diagnosis Date  . Diabetes mellitus without complication ([place])     There are no active problems to display for this patient.   Past Surgical History:  Procedure Laterality Date  . APPENDECTOMY      OB History   None      Home Medications    Prior to Admission medications   Medication Sig Start Date End Date Taking? Authorizing Provider  gabapentin (NEURONTIN) 100 MG capsule Take 100 mg by mouth at bedtime.   Yes [provider]  hydrOXYzine (ATARAX/VISTARIL) 25 MG tablet Take 1 tablet (25 mg total) by mouth 3 (three) times daily as needed for itching. 02/25/16  Yes Marylene Land, NP  metFORMIN (GLUCOPHAGE) 500 MG tablet Take 500 mg by mouth 2 (two) times daily with a meal.   Yes [provider]  traMADol (ULTRAM) 50 MG tablet Take 50 mg by mouth every 6 (six) hours as needed.   Yes [provider]  amoxicillin (AMOXIL) 875 MG tablet Take 1 tablet (875 mg total) by mouth 2 (two) times daily. 08/22/17   Norval Gable, MD  glipiZIDE (GLUCOTROL) 10 MG tablet Take 10 mg by mouth daily before breakfast.    [provider]     Family History History reviewed. No pertinent family history.  Social History Social History   Tobacco Use  . Smoking status: Current Every Day Smoker    Packs/day: 1.00    Types: Cigarettes  . Smokeless tobacco: Never Used  Substance Use Topics  . Alcohol use: Yes  . Drug use: No     Allergies   Patient has no known allergies.   Review of Systems Review of Systems  HENT: Positive for congestion, sinus pain and sore throat.   Respiratory: Negative for wheezing.   Neurological: Negative for headaches.     Physical Exam Triage Vital Signs ED Triage Vitals  Enc Vitals Group     BP 08/22/17 0934 (!) 142/93     Pulse Rate 08/22/17 0934 74     Resp 08/22/17 0934 16     Temp 08/22/17 0934 97.8 F (36.6 C)     Temp Source 08/22/17 0934 Oral     SpO2 08/22/17 0934 100 %     Weight 08/22/17 0933 161 lb (73 kg)     Height 08/22/17 0933 5\' 3"  (1.6 m)     Head Circumference --      Peak Flow --      Pain Score 08/22/17 0933 8     Pain Loc --      Pain Edu? --      Excl. in [place]? --  No data found.  Updated Vital Signs BP (!) 142/93 (BP Location: Right Arm)   Pulse 74   Temp 97.8 F (36.6 C) (Oral)   Resp 16   Ht 5\' 3"  (1.6 m)   Wt 161 lb (73 kg)   SpO2 100%   BMI 28.52 kg/m   Visual Acuity Right Eye Distance:   Left Eye Distance:   Bilateral Distance:    Right Eye Near:   Left Eye Near:    Bilateral Near:     Physical Exam  Constitutional: She appears well-developed and well-nourished. No distress.  HENT:  Head: Normocephalic and atraumatic.  Right Ear: Tympanic membrane, external ear and ear canal normal.  Left Ear: Tympanic membrane, external ear and ear canal normal.  Nose: Mucosal edema and rhinorrhea present. No nose lacerations, sinus tenderness, nasal deformity, septal deviation or nasal septal hematoma. No epistaxis.  No foreign bodies. Right sinus exhibits maxillary sinus tenderness and frontal sinus tenderness. Left sinus exhibits  maxillary sinus tenderness and frontal sinus tenderness.  Mouth/Throat: Uvula is midline, oropharynx is clear and moist and mucous membranes are normal. No oropharyngeal exudate.  Eyes: Conjunctivae are normal. Right eye exhibits no discharge. Left eye exhibits no discharge. No scleral icterus.  Neck: Normal range of motion. Neck supple. No thyromegaly present.  Cardiovascular: Normal rate, regular rhythm and normal heart sounds.  Pulmonary/Chest: Effort normal and breath sounds normal. No respiratory distress. She has no wheezes. She has no rales.  Lymphadenopathy:    She has no cervical adenopathy.  Skin: She is not diaphoretic.  Nursing note and vitals reviewed.    UC Treatments / Results  Labs (all labs ordered are listed, but only abnormal results are displayed) Labs Reviewed  RAPID STREP SCREEN ([facility] ONLY)  CULTURE, GROUP A STREP [facility])    EKG None  Radiology No results found.  Procedures Procedures (including critical care time)  Medications Ordered in UC Medications - No data to display  Initial Impression / Assessment and Plan / UC Course  I have reviewed the triage vital signs and the nursing notes.  Pertinent labs & imaging results that were available during my care of the patient were reviewed by me and considered in my medical decision making (see chart for details).      Final Clinical Impressions(s) / UC Diagnoses   Final diagnoses:  Acute maxillary sinusitis, recurrence not specified  Sore throat    ED Prescriptions    Medication Sig Dispense Auth. Provider   amoxicillin (AMOXIL) 875 MG tablet Take 1 tablet (875 mg total) by mouth 2 (two) times daily. 20 tablet Norval Gable, MD     1. Lab results and diagnosis reviewed with patient 2. rx as per orders above; reviewed possible side effects, interactions, risks and benefits  3. Recommend supportive treatment with otc analgesics prn, fluids, otc flonase 4. Follow-up prn if symptoms worsen or  don't improve   Controlled Substance Prescriptions [place] Controlled Substance Registry consulted? Not Applicable   Norval Gable, MD 08/22/17 1005

## 2017-08-24 LAB — CULTURE, GROUP A STREP (THRC)

## 2017-09-01 ENCOUNTER — Inpatient Hospital Stay: Admission: RE | Admit: 2017-09-01 | Payer: Self-pay | Source: Ambulatory Visit

## 2017-09-16 ENCOUNTER — Inpatient Hospital Stay: Admission: RE | Admit: 2017-09-16 | Payer: BLUE CROSS/BLUE SHIELD | Source: Ambulatory Visit

## 2017-09-18 ENCOUNTER — Encounter
Admission: RE | Admit: 2017-09-18 | Discharge: 2017-09-18 | Disposition: A | Discharge status: Home / Self Care | Payer: BLUE CROSS/BLUE SHIELD | Source: Ambulatory Visit | Attending: Obstetrics and Gynecology | Admitting: Obstetrics and Gynecology

## 2017-09-18 ENCOUNTER — Other Ambulatory Visit: Payer: Self-pay

## 2017-09-18 DIAGNOSIS — Z01810 Encounter for preprocedural cardiovascular examination: Secondary | ICD-10-CM | POA: Insufficient documentation

## 2017-09-18 DIAGNOSIS — Z01812 Encounter for preprocedural laboratory examination: Secondary | ICD-10-CM | POA: Diagnosis present

## 2017-09-18 HISTORY — DX: Hypothyroidism, unspecified: E03.9

## 2017-09-18 LAB — BASIC METABOLIC PANEL
ANION GAP: 11 (ref 5–15)
BUN: 11 mg/dL (ref 6–20)
CALCIUM: 9 mg/dL (ref 8.9–10.3)
CO2: 21 mmol/L — ABNORMAL LOW (ref 22–32)
CREATININE: 0.79 mg/dL (ref 0.44–1.00)
Chloride: 105 mmol/L (ref 101–111)
GFR calc Af Amer: >60 mL/min (ref >60)
GLUCOSE: 199 mg/dL — AB (ref 65–99)
Potassium: 3.7 mmol/L (ref 3.5–5.1)
Sodium: 137 mmol/L (ref 135–145)

## 2017-09-18 LAB — CBC
HCT: 36.5 % (ref 35.0–47.0)
HEMOGLOBIN: 12.4 g/dL (ref 12.0–16.0)
MCH: 32.4 pg (ref 26.0–34.0)
MCHC: 34 g/dL (ref 32.0–36.0)
MCV: 95.5 fL (ref 80.0–100.0)
PLATELETS: 223 10*3/uL (ref 150–440)
RBC: 3.82 MIL/uL (ref 3.80–5.20)
RDW: 13.8 % (ref 11.5–14.5)
WBC: 6.3 10*3/uL (ref 3.6–11.0)

## 2017-09-18 MED ORDER — FLEET ENEMA 7-19 GM/118ML RE ENEM
1.0000 | ENEMA | Freq: Once | RECTAL | Status: DC
  Filled 2017-09-18: qty 1

## 2017-09-18 NOTE — H&P (Signed)
Ms. Brierley is a 62 y.o. female here for [facility] and anterior + posterior repair  .Pt with .  Was using a  Pessary but doesn't want to continue  Long time tobacco user, quit . Some SUI . + sexually active . Denies chronic cough . No Splinting problems with BM.    Past Medical History:  has a past medical history of Anxiety, Depression, Tobacco dependence, and Type II or unspecified type diabetes mellitus without mention of complication, not stated as uncontrolled (CMS-HCC) (1980).  Past Surgical History:  has a past surgical history that includes Appendectomy; Blepharoplasty (2006); Colposcopy; Appendectomy; and Tracheostomy (01/2017). Family History: family history includes Alcohol abuse in her father; COPD in her mother; Dementia in her father; Diabetes in her unknown relative; Heart disease in her other. Social History:  reports that she quit smoking about 6 months ago. Her smoking use included cigarettes. She smoked 0.50 packs per day. She has never used smokeless tobacco. She reports that she drinks about 7.2 oz of alcohol per week. She reports that she does not use drugs. OB/GYN History:          OB History    Gravida  3   Para  3   Term  3   Preterm      AB      Living  3     SAB      TAB      Ectopic      Molar      Multiple      Live Births  3          Allergies: has No Known Allergies. Medications:  Current Outpatient Medications:  .  gabapentin (NEURONTIN) 100 MG capsule, Take 1 capsule (100 mg total) by mouth 2 (two) times daily as needed, Disp: 60 capsule, Rfl: 3 .  glipiZIDE (GLUCOTROL) 10 MG tablet, Take 1 tablet (10 mg total) by mouth every morning before breakfast, Disp: 90 tablet, Rfl: 3 .  levothyroxine (SYNTHROID, LEVOTHROID) 100 MCG tablet, Take 1 tablet (100 mcg total) by mouth once daily Take on an empty stomach with a glass of water at least 30-60 minutes before breakfast., Disp: 30 tablet, Rfl: 11 .  metFORMIN (GLUCOPHAGE-XR) 500 MG XR  tablet, Take 1 tablet (500 mg total) by mouth 2 (two) times daily, Disp: 180 tablet, Rfl: 1 .  Compound Medication, Estriol 1 mg/g Use 1/4 app per vagina once daily x 1 week then use 3 x weekly til surgery Called to [facility], Disp: 1 each, Rfl: 1  Review of Systems: General:                      No fatigue or weight loss Eyes:                           No vision changes Ears:                            No hearing difficulty Respiratory:                No cough or shortness of breath Pulmonary:                  No asthma or shortness of breath Cardiovascular:           No chest pain, palpitations, dyspnea on exertion Gastrointestinal:  No abdominal bloating, chronic diarrhea, constipations, masses, pain or hematochezia Genitourinary:             No hematuria, dysuria, abnormal vaginal discharge, pelvic pain, Menometrorrhagia Lymphatic:                   No swollen lymph nodes Musculoskeletal:         No muscle weakness Neurologic:                  No extremity weakness, syncope, seizure disorder Psychiatric:                  No history of depression, delusions or suicidal/homicidal ideation    Exam:      Vitals:   09/02/17 0837  BP: (!) 146/90  Pulse: 72    Body mass index is 28.77 kg/m.  WDWN white/ female in NAD   Lungs: CTA  CV : RRR without murmur   Neck:  no thyromegaly Abdomen: soft , no mass, normal active bowel sounds,  non-tender, no rebound tenderness Pelvic: tanner stage 5 ,  External genitalia: vulva /labia no lesions Urethra: no prolapse Vagina: normal physiologic d/c, third degree cystocele and 1-2 degree rectocele  Cervix: no lesions, no cervical motion tenderness   Uterus: normal size shape and contour, non-tender, second- third degree  descensus Adnexa: no mass,  non-tender   Rectovaginal: Impression:   The primary encounter diagnosis was Midline cystocele. Diagnoses of Uterus descensus and Rectocele, female were also pertinent to this  visit.    Plan:  TVH / BSo and Anterior and posterior colporrhaphy Spoke to her about the pro and cons of surgery . Risks of reformation of cystocele in future . Risks of incontinence . Risk of dyspareunia with removal / tightening of tissue .  Benefits and risks to surgery: The proposed benefit of the surgery has been discussed with the patient. The possible risks include, but are not limited to: organ injury to the bowel , bladder, ureters, and major blood vessels and nerves. There is a possibility of additional surgeries resulting from these injuries. There is also the risk of blood transfusion and the need to receive blood products during or after the procedure which may rarely lead to HIV or Hepatitis C infection. There is a risk of developing a deep venous thrombosis or a pulmonary embolism . There is the possibility of wound infection and also anesthetic complications, even the rare possibility of death. The patient understands these risks and wishes to proceed. All questions have been answered and the consent has been signed.     Caroline Sauger, MD

## 2017-09-18 NOTE — Patient Instructions (Signed)
Your procedure is scheduled on:Mon.6 /3/19  Report to Day Surgery. To find out your arrival time please call [phone] between 1PM - 3PM on Friday 09/19/17  Remember: Instructions that are not followed completely may result in serious medical risk, up to and including death, or upon the discretion of your surgeon and anesthesiologist your surgery may need to be rescheduled.     _X__ 1. Do not eat food after midnight the night before your procedure.                 No gum chewing or hard candies. You may drink clear liquids up to 2 hours                 before you are scheduled to arrive for your surgery- DO not drink clear                 liquids within 2 hours of the start of your surgery.                 Clear Liquids include:  water,  __X__2.  On the morning of surgery brush your teeth with toothpaste and water, you may rinse your mouth with mouthwash if you wish.  Do not swallow any  toothpaste of mouthwash.     _X__ 3.  No Alcohol for 24 hours before or after surgery.   _X__ 4.  Do Not Smoke or use e-cigarettes For 24 Hours Prior to Your Surgery.                 Do not use any chewable tobacco products for at least 6 hours prior to                 surgery.  ____  5.  Bring all medications with you on the day of surgery if instructed.   __x__  6.  Notify your doctor if there is any change in your medical condition      (cold, fever, infections).     Do not wear jewelry, make-up, hairpins, clips or nail polish. Do not wear lotions, powders, or perfumes. You may wear deodorant. Do not shave 48 hours prior to surgery. Men may shave face and neck. Do not bring valuables to the hospital.    [facility] is not responsible for any belongings or valuables.  Contacts, dentures or bridgework may not be worn into surgery. Leave your suitcase in the car. After surgery it may be brought to your room. For patients admitted to the hospital, discharge time is  determined by your treatment team.   Patients discharged the day of surgery will not be allowed to drive home.   Please read over the following fact sheets that you were given:    _x___ Take these medicines the morning of surgery with A SIP OF WATER:    1. levothyroxine (SYNTHROID, LEVOTHROID) 100 MCG tablet  2.   3.   4.  5.  6.  _x___ Fleet Enema (as directed)   __x__ Use CHG Soap as directed  ____ Use inhalers on the day of surgery  _x___ Stop metformin 2 days prior to surgery Last dose on 5/31    ____ Take 1/2 of usual insulin dose the night before surgery. No insulin the morning          of surgery.   ____ Stop Coumadin/Plavix/aspirin on   ____ Stop Anti-inflammatories on    ____ Stop supplements until after surgery.  ____ Bring C-Pap to the hospital.

## 2017-09-18 NOTE — Care Management (Signed)
New EKG shows a definite change. Consult needed.

## 2017-09-18 NOTE — Pre-Procedure Instructions (Signed)
AS INSTRUCTED BY DR Gildardo Griffes, NEEDS CARDIAC CLEARANCE. AND REQUEST WITH EKG CALLED AND FAXED TO [place]

## 2017-09-19 LAB — TYPE AND SCREEN
ABO/RH(D): A NEG
Antibody Screen: POSITIVE

## 2017-09-19 NOTE — OR Nursing (Signed)
Per Denise Sanders in Blood Bank, patient has positive antibodies identified during Type and Screen

## 2017-09-22 ENCOUNTER — Observation Stay
Admission: RE | Admit: 2017-09-22 | Discharge: 2017-09-23 | Disposition: A | Discharge status: Home / Self Care | Payer: BLUE CROSS/BLUE SHIELD | Source: Ambulatory Visit | Attending: Obstetrics and Gynecology | Admitting: Obstetrics and Gynecology

## 2017-09-22 ENCOUNTER — Encounter
Admission: RE | Disposition: A | Discharge status: Home / Self Care | Payer: Self-pay | Source: Ambulatory Visit | Attending: Obstetrics and Gynecology

## 2017-09-22 ENCOUNTER — Other Ambulatory Visit: Payer: Self-pay

## 2017-09-22 ENCOUNTER — Ambulatory Visit: Payer: BLUE CROSS/BLUE SHIELD | Admitting: Anesthesiology

## 2017-09-22 ENCOUNTER — Encounter: Payer: Self-pay | Admitting: *Deleted

## 2017-09-22 DIAGNOSIS — N816 Rectocele: Secondary | ICD-10-CM | POA: Diagnosis not present

## 2017-09-22 DIAGNOSIS — E119 Type 2 diabetes mellitus without complications: Secondary | ICD-10-CM | POA: Insufficient documentation

## 2017-09-22 DIAGNOSIS — N8110 Cystocele, unspecified: Principal | ICD-10-CM | POA: Insufficient documentation

## 2017-09-22 DIAGNOSIS — N800 Endometriosis of uterus: Secondary | ICD-10-CM | POA: Insufficient documentation

## 2017-09-22 DIAGNOSIS — Z79890 Hormone replacement therapy (postmenopausal): Secondary | ICD-10-CM | POA: Diagnosis not present

## 2017-09-22 DIAGNOSIS — N8189 Other female genital prolapse: Secondary | ICD-10-CM | POA: Diagnosis present

## 2017-09-22 DIAGNOSIS — Z87891 Personal history of nicotine dependence: Secondary | ICD-10-CM | POA: Diagnosis not present

## 2017-09-22 DIAGNOSIS — Z7984 Long term (current) use of oral hypoglycemic drugs: Secondary | ICD-10-CM | POA: Insufficient documentation

## 2017-09-22 DIAGNOSIS — Z79899 Other long term (current) drug therapy: Secondary | ICD-10-CM | POA: Diagnosis not present

## 2017-09-22 DIAGNOSIS — N72 Inflammatory disease of cervix uteri: Secondary | ICD-10-CM | POA: Diagnosis not present

## 2017-09-22 DIAGNOSIS — Z9889 Other specified postprocedural states: Secondary | ICD-10-CM

## 2017-09-22 HISTORY — PX: VAGINAL HYSTERECTOMY: SHX2639

## 2017-09-22 HISTORY — PX: BILATERAL SALPINGECTOMY: SHX5743

## 2017-09-22 HISTORY — PX: ANTERIOR AND POSTERIOR REPAIR: SHX5121

## 2017-09-22 LAB — GLUCOSE, CAPILLARY
Glucose-Capillary: 178 mg/dL — ABNORMAL HIGH (ref 65–99)
Glucose-Capillary: 177 mg/dL — ABNORMAL HIGH (ref 65–99)
Glucose-Capillary: 222 mg/dL — ABNORMAL HIGH (ref 65–99)

## 2017-09-22 LAB — ABO/RH: ABO/RH(D): A NEG

## 2017-09-22 SURGERY — HYSTERECTOMY, VAGINAL
Anesthesia: General | Wound class: Clean Contaminated

## 2017-09-22 MED ORDER — SODIUM CHLORIDE FLUSH 0.9 % IV SOLN
INTRAVENOUS | Status: AC
  Filled 2017-09-22: qty 10

## 2017-09-22 MED ORDER — ESTROGENS, CONJUGATED 0.625 MG/GM VA CREA
TOPICAL_CREAM | VAGINAL | Status: AC
  Filled 2017-09-22: qty 30

## 2017-09-22 MED ORDER — FENTANYL CITRATE (PF) 100 MCG/2ML IJ SOLN
INTRAMUSCULAR | Status: DC | PRN
  Administered 2017-09-22 (×3): 50 ug via INTRAVENOUS

## 2017-09-22 MED ORDER — DEXAMETHASONE SODIUM PHOSPHATE 10 MG/ML IJ SOLN
INTRAMUSCULAR | Status: AC
  Filled 2017-09-22: qty 1

## 2017-09-22 MED ORDER — KETOROLAC TROMETHAMINE 30 MG/ML IJ SOLN
INTRAMUSCULAR | Status: DC | PRN
  Administered 2017-09-22: 15 mg via INTRAVENOUS

## 2017-09-22 MED ORDER — SEVOFLURANE IN SOLN
RESPIRATORY_TRACT | Status: AC
  Filled 2017-09-22: qty 250

## 2017-09-22 MED ORDER — SILVER NITRATE-POT NITRATE 75-25 % EX MISC
CUTANEOUS | Status: AC
  Filled 2017-09-22: qty 4

## 2017-09-22 MED ORDER — LIDOCAINE-EPINEPHRINE 1 %-1:100000 IJ SOLN
INTRAMUSCULAR | Status: AC
  Filled 2017-09-22: qty 1

## 2017-09-22 MED ORDER — ONDANSETRON HCL 4 MG/2ML IJ SOLN
INTRAMUSCULAR | Status: DC | PRN
  Administered 2017-09-22: 4 mg via INTRAVENOUS

## 2017-09-22 MED ORDER — KETOROLAC TROMETHAMINE 30 MG/ML IJ SOLN
30.0000 mg | Freq: Three times a day (TID) | INTRAMUSCULAR | Status: DC | PRN
  Administered 2017-09-22 – 2017-09-23 (×3): 30 mg via INTRAVENOUS
  Filled 2017-09-22 (×3): qty 1

## 2017-09-22 MED ORDER — ONDANSETRON HCL 4 MG/2ML IJ SOLN: 4.0000 mg | Freq: Four times a day (QID) | INTRAMUSCULAR | Status: DC | PRN

## 2017-09-22 MED ORDER — DEXAMETHASONE SODIUM PHOSPHATE 10 MG/ML IJ SOLN
INTRAMUSCULAR | Status: DC | PRN
  Administered 2017-09-22: 5 mg via INTRAVENOUS

## 2017-09-22 MED ORDER — CEFAZOLIN SODIUM-DEXTROSE 2-4 GM/100ML-% IV SOLN
2.0000 g | Freq: Once | INTRAVENOUS | Status: AC
  Administered 2017-09-22: 2 g via INTRAVENOUS

## 2017-09-22 MED ORDER — FAMOTIDINE 20 MG PO TABS
20.0000 mg | ORAL_TABLET | Freq: Once | ORAL | Status: AC
  Administered 2017-09-22: 20 mg via ORAL

## 2017-09-22 MED ORDER — FENTANYL CITRATE (PF) 100 MCG/2ML IJ SOLN
INTRAMUSCULAR | Status: AC
Start: 2017-09-22 — End: 2017-09-22
  Administered 2017-09-22: 25 ug via INTRAVENOUS
  Filled 2017-09-22: qty 2

## 2017-09-22 MED ORDER — EPHEDRINE SULFATE 50 MG/ML IJ SOLN
INTRAMUSCULAR | Status: AC
  Filled 2017-09-22: qty 1

## 2017-09-22 MED ORDER — FENTANYL CITRATE (PF) 100 MCG/2ML IJ SOLN
25.0000 ug | INTRAMUSCULAR | Status: DC | PRN
  Administered 2017-09-22 (×4): 25 ug via INTRAVENOUS

## 2017-09-22 MED ORDER — ACETAMINOPHEN 10 MG/ML IV SOLN
INTRAVENOUS | Status: AC
  Filled 2017-09-22: qty 100

## 2017-09-22 MED ORDER — SUCCINYLCHOLINE CHLORIDE 20 MG/ML IJ SOLN
INTRAMUSCULAR | Status: DC | PRN
  Administered 2017-09-22: 80 mg via INTRAVENOUS

## 2017-09-22 MED ORDER — ONDANSETRON HCL 4 MG PO TABS: 4.0000 mg | ORAL_TABLET | Freq: Four times a day (QID) | ORAL | Status: DC | PRN

## 2017-09-22 MED ORDER — SIMETHICONE 80 MG PO CHEW
80.0000 mg | CHEWABLE_TABLET | Freq: Four times a day (QID) | ORAL | Status: DC | PRN
  Administered 2017-09-22: 80 mg via ORAL
  Filled 2017-09-22: qty 1

## 2017-09-22 MED ORDER — LABETALOL HCL 5 MG/ML IV SOLN
20.0000 mg | INTRAVENOUS | Status: DC | PRN
  Administered 2017-09-22: 20 mg via INTRAVENOUS
  Filled 2017-09-22 (×2): qty 4

## 2017-09-22 MED ORDER — SUGAMMADEX SODIUM 200 MG/2ML IV SOLN
INTRAVENOUS | Status: AC
  Filled 2017-09-22: qty 2

## 2017-09-22 MED ORDER — ONDANSETRON HCL 4 MG/2ML IJ SOLN: 4.0000 mg | Freq: Once | INTRAMUSCULAR | Status: DC | PRN

## 2017-09-22 MED ORDER — OXYCODONE-ACETAMINOPHEN 5-325 MG PO TABS
1.0000 | ORAL_TABLET | ORAL | Status: DC | PRN
  Administered 2017-09-22 – 2017-09-23 (×4): 1 via ORAL
  Filled 2017-09-22 (×4): qty 1

## 2017-09-22 MED ORDER — PROPOFOL 10 MG/ML IV BOLUS
INTRAVENOUS | Status: DC | PRN
  Administered 2017-09-22: 120 mg via INTRAVENOUS

## 2017-09-22 MED ORDER — SODIUM CHLORIDE 0.9 % IJ SOLN
INTRAMUSCULAR | Status: AC
  Filled 2017-09-22: qty 10

## 2017-09-22 MED ORDER — PROPOFOL 10 MG/ML IV BOLUS
INTRAVENOUS | Status: AC
  Filled 2017-09-22: qty 20

## 2017-09-22 MED ORDER — MIDAZOLAM HCL 2 MG/2ML IJ SOLN
INTRAMUSCULAR | Status: DC | PRN
  Administered 2017-09-22: 2 mg via INTRAVENOUS

## 2017-09-22 MED ORDER — MIDAZOLAM HCL 2 MG/2ML IJ SOLN
INTRAMUSCULAR | Status: AC
  Filled 2017-09-22: qty 2

## 2017-09-22 MED ORDER — LIDOCAINE-EPINEPHRINE 1 %-1:100000 IJ SOLN
INTRAMUSCULAR | Status: DC | PRN
  Administered 2017-09-22: 7 mL
  Administered 2017-09-22: 3 mL
  Administered 2017-09-22: 9 mL

## 2017-09-22 MED ORDER — CEFAZOLIN SODIUM-DEXTROSE 2-4 GM/100ML-% IV SOLN
INTRAVENOUS | Status: AC
  Filled 2017-09-22: qty 100

## 2017-09-22 MED ORDER — SODIUM CHLORIDE 0.9 % IV SOLN
INTRAVENOUS | Status: DC
  Administered 2017-09-22: 07:00:00 via INTRAVENOUS

## 2017-09-22 MED ORDER — LIDOCAINE HCL (PF) 2 % IJ SOLN
INTRAMUSCULAR | Status: AC
  Filled 2017-09-22: qty 10

## 2017-09-22 MED ORDER — LACTATED RINGERS IV SOLN
INTRAVENOUS | Status: DC
  Administered 2017-09-22 – 2017-09-23 (×3): via INTRAVENOUS

## 2017-09-22 MED ORDER — ACETAMINOPHEN 10 MG/ML IV SOLN
INTRAVENOUS | Status: DC | PRN
  Administered 2017-09-22: 1000 mg via INTRAVENOUS

## 2017-09-22 MED ORDER — SUGAMMADEX SODIUM 200 MG/2ML IV SOLN
INTRAVENOUS | Status: DC | PRN
  Administered 2017-09-22: 100 mg via INTRAVENOUS

## 2017-09-22 MED ORDER — ROCURONIUM BROMIDE 100 MG/10ML IV SOLN
INTRAVENOUS | Status: DC | PRN
  Administered 2017-09-22: 5 mg via INTRAVENOUS
  Administered 2017-09-22: 10 mg via INTRAVENOUS
  Administered 2017-09-22: 20 mg via INTRAVENOUS
  Administered 2017-09-22: 10 mg via INTRAVENOUS
  Administered 2017-09-22: 5 mg via INTRAVENOUS

## 2017-09-22 MED ORDER — SUCCINYLCHOLINE CHLORIDE 20 MG/ML IJ SOLN
INTRAMUSCULAR | Status: AC
  Filled 2017-09-22: qty 1

## 2017-09-22 MED ORDER — KETOROLAC TROMETHAMINE 30 MG/ML IJ SOLN
INTRAMUSCULAR | Status: AC
  Filled 2017-09-22: qty 1

## 2017-09-22 MED ORDER — FAMOTIDINE 20 MG PO TABS
ORAL_TABLET | ORAL | Status: AC
  Administered 2017-09-22: 20 mg via ORAL
  Filled 2017-09-22: qty 1

## 2017-09-22 MED ORDER — FENTANYL CITRATE (PF) 250 MCG/5ML IJ SOLN
INTRAMUSCULAR | Status: AC
Start: 2017-09-22 — End: ?
  Filled 2017-09-22: qty 5

## 2017-09-22 MED ORDER — ESTROGENS, CONJUGATED 0.625 MG/GM VA CREA
TOPICAL_CREAM | VAGINAL | Status: DC | PRN
  Administered 2017-09-22: 1 via VAGINAL

## 2017-09-22 MED ORDER — LIDOCAINE HCL (CARDIAC) PF 100 MG/5ML IV SOSY
PREFILLED_SYRINGE | INTRAVENOUS | Status: DC | PRN
  Administered 2017-09-22: 100 mg via INTRAVENOUS

## 2017-09-22 MED ORDER — ONDANSETRON HCL 4 MG/2ML IJ SOLN
INTRAMUSCULAR | Status: AC
  Filled 2017-09-22: qty 2

## 2017-09-22 MED ORDER — GLYCOPYRROLATE 0.2 MG/ML IJ SOLN
INTRAMUSCULAR | Status: AC
  Filled 2017-09-22: qty 1

## 2017-09-22 MED ORDER — MORPHINE SULFATE (PF) 2 MG/ML IV SOLN: 1.0000 mg | INTRAVENOUS | Status: DC | PRN

## 2017-09-22 MED ORDER — ROCURONIUM BROMIDE 50 MG/5ML IV SOLN
INTRAVENOUS | Status: AC
  Filled 2017-09-22: qty 1

## 2017-09-22 MED ORDER — METFORMIN HCL 500 MG PO TABS
500.0000 mg | ORAL_TABLET | Freq: Two times a day (BID) | ORAL | Status: DC
  Administered 2017-09-22 – 2017-09-23 (×2): 500 mg via ORAL
  Filled 2017-09-22 (×3): qty 1

## 2017-09-22 MED ORDER — PHENYLEPHRINE HCL 10 MG/ML IJ SOLN
INTRAMUSCULAR | Status: AC
  Filled 2017-09-22: qty 1

## 2017-09-22 SURGICAL SUPPLY — 43 items
BAG URINE DRAINAGE (UROLOGICAL SUPPLIES) ×4 IMPLANT
CANISTER SUCT 1200ML W/VALVE (MISCELLANEOUS) ×4 IMPLANT
CATH FOLEY 2WAY  5CC 16FR (CATHETERS) ×2
CATH ROBINSON RED A/P 16FR (CATHETERS) ×4 IMPLANT
CATH URTH 16FR FL 2W BLN LF (CATHETERS) ×2 IMPLANT
COVER LIGHT HANDLE STERIS (MISCELLANEOUS) ×4 IMPLANT
DRAPE PERI LITHO V/GYN (MISCELLANEOUS) ×4 IMPLANT
DRAPE SURG 17X11 SM STRL (DRAPES) ×4 IMPLANT
DRAPE UNDER BUTTOCK W/FLU (DRAPES) ×4 IMPLANT
ELECT REM PT RETURN 9FT ADLT (ELECTROSURGICAL) ×4
ELECTRODE REM PT RTRN 9FT ADLT (ELECTROSURGICAL) ×2 IMPLANT
ETHIBOND 2 0 GREEN CT 2 30IN (SUTURE) IMPLANT
GAUZE PACK 2X3YD (MISCELLANEOUS) IMPLANT
GAUZE SPONGE 4X4 16PLY XRAY LF (GAUZE/BANDAGES/DRESSINGS) ×4 IMPLANT
GLOVE BIO SURGEON STRL SZ8 (GLOVE) ×4 IMPLANT
GOWN STRL REUS W/ TWL LRG LVL3 (GOWN DISPOSABLE) ×6 IMPLANT
GOWN STRL REUS W/ TWL XL LVL3 (GOWN DISPOSABLE) ×2 IMPLANT
GOWN STRL REUS W/TWL LRG LVL3 (GOWN DISPOSABLE) ×6
GOWN STRL REUS W/TWL XL LVL3 (GOWN DISPOSABLE) ×2
KIT TURNOVER CYSTO (KITS) ×4 IMPLANT
KIT TURNOVER KIT A (KITS) ×4 IMPLANT
LABEL OR SOLS (LABEL) IMPLANT
NEEDLE HYPO 22GX1.5 SAFETY (NEEDLE) ×4 IMPLANT
NS IRRIG 500ML POUR BTL (IV SOLUTION) ×4 IMPLANT
PACK BASIN MINOR ARMC (MISCELLANEOUS) ×4 IMPLANT
PAD OB MATERNITY 4.3X12.25 (PERSONAL CARE ITEMS) ×4 IMPLANT
PAD PREP 24X41 OB/GYN DISP (PERSONAL CARE ITEMS) ×4 IMPLANT
SOL PREP PVP 2OZ (MISCELLANEOUS) ×4
SOLUTION PREP PVP 2OZ (MISCELLANEOUS) ×2 IMPLANT
SURGILUBE 2OZ TUBE FLIPTOP (MISCELLANEOUS) ×4 IMPLANT
SUT PDS 2-0 27IN (SUTURE) ×4 IMPLANT
SUT PDS AB 2-0 CT1 27 (SUTURE) ×4 IMPLANT
SUT VIC AB 0 CT1 27 (SUTURE) ×4
SUT VIC AB 0 CT1 27XCR 8 STRN (SUTURE) ×4 IMPLANT
SUT VIC AB 0 CT1 36 (SUTURE) ×12 IMPLANT
SUT VIC AB 2-0 CT1 36 (SUTURE) ×4 IMPLANT
SUT VIC AB 2-0 SH 27 (SUTURE) ×10
SUT VIC AB 2-0 SH 27XBRD (SUTURE) ×10 IMPLANT
SUT VIC AB 3-0 SH 27 (SUTURE) ×2
SUT VIC AB 3-0 SH 27X BRD (SUTURE) ×2 IMPLANT
SYR 10ML LL (SYRINGE) ×4 IMPLANT
SYR CONTROL 10ML (SYRINGE) ×4 IMPLANT
WATER STERILE IRR 1000ML POUR (IV SOLUTION) ×4 IMPLANT

## 2017-09-22 NOTE — Progress Notes (Signed)
Pt ready for TVH BSO and A+P repair . NPO all questions answered  She has received cardiac clearance.

## 2017-09-22 NOTE — Op Note (Signed)
NAME: Denise Sanders, Denise Sanders MEDICAL RECORD ZW:[id number] ACCOUNT [id number] DATE OF BIRTH:06-05-1955 FACILITY: [facility] LOCATION: [facility] PHYSICIAN:THOMAS Josefine Class, MD  OPERATIVE REPORT  DATE OF PROCEDURE:  09/22/2017  PREOPERATIVE DIAGNOSIS:  Pelvic organ prolapse.  POSTOPERATIVE DIAGNOSIS:  Pelvic organ prolapse.  PROCEDURE: 1.  Total vaginal hysterectomy. 2.  Anterior and posterior colporrhaphy. 3.  Perineoplasty.  SURGEON:  Laverta Baltimore, MD.  FIRST ASSISTANT:  Larey Days, MD  SECOND ASSISTANT:  PA student, Verne Grain.  ANESTHESIA:  General endotracheal anesthesia.  INDICATION:  A 62 year old female with pelvic organ prolapse including a second-degree uterine descensus, third-degree cystocele and first to second-degree rectocele.  The patient has declined pessary use and wishes definitive surgery.  DESCRIPTION OF PROCEDURE:  After adequate general endotracheal anesthesia, the patient was placed in dorsal supine position, legs in the candy cane stirrups.  The patient's lower abdomen, perineum and vagina were prepped and draped in normal sterile  fashion.  The patient did receive 2 grams IV Ancef prior to commencement of the case.  Timeout was performed.  A weighted speculum was placed in the posterior vaginal vault and the anterior and posterior cervix was grasped with thyroid tenacula.  Cervix  was circumferentially injected with 1% lidocaine with 1:100,000 epinephrine.  A direct posterior colpotomy incision was made.  Upon entry into the posterior cul-de-sac, a long weighted bill speculum was placed.  The uterosacral ligaments were then  bilaterally clamped, transected and suture ligated with 0 Vicryl suture and tagged for identification to be used later in the case.  The anterior cervix was circumferentially incised with the Bovie.  The anterior cul-de-sac was entered sharply without  difficulty.  A Deaver retractor was placed within to elevate the bladder  anteriorly.  Cardinal ligaments were then bilaterally clamped, transected, suture ligated with 0 Vicryl suture.  The uterine arteries were then bilaterally clamped, transected,  suture ligated with 0 Vicryl suture.  The cornua were then bilaterally clamped and the uterus was removed intact.  Each pedicle was closed with 0 Vicryl suture.  The ovaries were extremely high in the pelvis and therefore could not be removed as surgeon  and had planned on.  The fallopian tubes were identified and partial salpingectomies were done bilaterally and pedicles were closed with 0 Vicryl suture.  Good hemostasis was noted.  The peritoneum was then closed in a pursestring fashion with 2-0 PDS  suture.  The anterior vaginal epithelium was then placed on traction with two Allis clamps and subepithelial tissues were injected with the lidocaine with epinephrine solution.  The vaginal epithelium was then dissected with Metzenbaum scissors and  opened and the tissues were then grasped with hemostats.  The incision line was made to about 1.5 cm inferior to the urethral meatus.  The bladder was then reflected off the subepithelial tissues bilaterally once adequate dissection was performed.  The  defect was closed with 2-0 Vicryl suture with mattress sutures in an interrupted fashion.  Good reduction of the cystocele.  The vaginal epithelium were then trimmed and reapproximated centrally.  Rest of the vaginal cuff was then closed with a running 0  Vicryl suture.  Attention was then directed to the posterior aspect of the vagina.  A triangle-shape incision was made from the hymenal ring down inferiorly to the perineal body after injecting with lidocaine and epinephrine.  Small incision was made at  the hymenal ring and the subvaginal epithelial tissues were tunneled with Metzenbaum scissors and opened.  The rectal defect was then dissected free  from the vaginal epithelium, again using interrupted mattress sutures of 2-0 Vicryl the  defect was  closed.  Vaginal tissues were trimmed and the rest of the vaginal epithelium was closed.  The perineal body was closed with a crown suture and the skin was reapproximated with 3-0 Vicryl in a subcuticular fashion.  Post-repair, two fingers were able to  be placed into the vagina demonstrating adequate depth of vagina and adequate accommodation for sexual activity in the future.  The vaginal epithelium was then packed with Kerlix roll with Premarin cream.  Foley catheter was placed at the end of the  case.  Of note, the bladder was drained prior to commencement of the case and during the procedure.  Foley catheter will remain in.  Total urine output 75 mL initially and an additional 25 mL after replacement of the Foley.  There were no complications.   The patient tolerated the procedure well.    ESTIMATED BLOOD LOSS:  100 mL.  URINE OUTPUT:  100 mL.    INTRAOPERATIVE FLUIDS:  1000 mL.    The patient did receive 1 g of intravenous Tylenol and 30 mg of Toradol intravenously prior to finishing the case.  The patient was taken to recovery room in good condition.  TN/NUANCE  D:09/22/2017 T:09/22/2017 JOB:[id number]

## 2017-09-22 NOTE — Transfer of Care (Signed)
Immediate Anesthesia Transfer of Care Note  Patient: Denise Sanders  Procedure(s) Performed: HYSTERECTOMY VAGINAL (N/A ) BILATERAL SALPINGECTOMY (Bilateral ) ANTERIOR (CYSTOCELE) AND POSTERIOR REPAIR (RECTOCELE) (N/A )  Patient Location: PACU  Anesthesia Type:General  Level of Consciousness: awake  Airway & Oxygen Therapy: Patient Spontanous Breathing  Post-op Assessment: Report given to RN  Post vital signs: stable  Last Vitals:  Vitals Value Taken Time  BP 125/71 09/22/2017 10:34 AM  Temp 37.2 C 09/22/2017 10:34 AM  Pulse 85 09/22/2017 10:40 AM  Resp 13 09/22/2017 10:40 AM  SpO2 94 % 09/22/2017 10:40 AM  Vitals shown include unvalidated device data.  Last Pain:  Vitals:   09/22/17 1034  TempSrc:   PainSc: Asleep         Complications: No apparent anesthesia complications

## 2017-09-22 NOTE — Anesthesia Preprocedure Evaluation (Signed)
Anesthesia Evaluation  Patient identified by MRN, date of birth, ID band Patient awake    Reviewed: Allergy & Precautions, NPO status , Patient's Chart, lab work & pertinent test results  Airway Mallampati: II  TM Distance: >3 FB     Dental   Pulmonary Current Smoker,    Pulmonary exam normal        Cardiovascular negative cardio ROS Normal cardiovascular exam     Neuro/Psych negative neurological ROS  negative psych ROS   GI/Hepatic negative GI ROS, Neg liver ROS,   Endo/Other  diabetesHypothyroidism   Renal/GU negative Renal ROS  negative genitourinary   Musculoskeletal negative musculoskeletal ROS (+)   Abdominal Normal abdominal exam  (+)   Peds negative pediatric ROS (+)  Hematology negative hematology ROS (+)   Anesthesia Other Findings   Reproductive/Obstetrics                             Anesthesia Physical Anesthesia Plan  ASA: II  Anesthesia Plan: General   Post-op Pain Management:    Induction: Intravenous  PONV Risk Score and Plan:   Airway Management Planned: Oral ETT  Additional Equipment:   Intra-op Plan:   Post-operative Plan: Extubation in OR  Informed Consent: I have reviewed the patients History and Physical, chart, labs and discussed the procedure including the risks, benefits and alternatives for the proposed anesthesia with the patient or authorized representative who has indicated his/her understanding and acceptance.   Dental advisory given  Plan Discussed with: CRNA and Surgeon  Anesthesia Plan Comments:         Anesthesia Quick Evaluation

## 2017-09-22 NOTE — Brief Op Note (Signed)
09/22/2017  10:20 AM  PATIENT:  Denise Sanders  62 y.o. female  PRE-OPERATIVE DIAGNOSIS:  pelvic organ prolapse  POST-OPERATIVE DIAGNOSIS:  pelvic organ prolapse  PROCEDURE:  Procedure(s): HYSTERECTOMY VAGINAL (N/A) BILATERAL SALPINGECTOMY (Bilateral) ANTERIOR (CYSTOCELE) AND POSTERIOR REPAIR (RECTOCELE) (N/A) perineoplasty SURGEON:  Surgeon(s) and Role:    * Daisie Haft, Gwen Her, MD - Primary    * Ward, Honor Loh, MD - Assisting  PHYSICIAN ASSISTANT:  PA student Sanjuana Mae  ASSISTANTS: none   ANESTHESIA:   general  EBL:  100 mL   BLOOD ADMINISTERED:none  DRAINS: Urinary Catheter (Foley)   LOCAL MEDICATIONS USED:  LIDOCAINE  and Amount: 20 ml  SPECIMEN:  Source of Specimen:  uterus , portion right and left tube  DISPOSITION OF SPECIMEN:  PATHOLOGY  COUNTS:  YES  TOURNIQUET:  * No tourniquets in log *  DICTATION: .Other Dictation: Dictation Number verbal  PLAN OF CARE: Admit for overnight observation  PATIENT DISPOSITION:  PACU - hemodynamically stable.   Delay start of Pharmacological VTE agent (>24hrs) due to surgical blood loss or risk of bleeding: not applicable

## 2017-09-22 NOTE — Progress Notes (Signed)
Patient ID: Denise Sanders, female   DOB: 1955-11-10, 62 y.o.   MRN: [id number] DOS . Doing well  UO good   VSS  Received one dose of Labetalol for elevated BP .  And she has gotten her metformin  Voiding trial in am

## 2017-09-22 NOTE — Anesthesia Procedure Notes (Signed)
Procedure Name: Intubation Date/Time: 09/22/2017 7:55 AM Performed by: Zetta Bills, CRNA Pre-anesthesia Checklist: Patient identified, Emergency Drugs available, Suction available and Patient being monitored Patient Re-evaluated:Patient Re-evaluated prior to induction Oxygen Delivery Method: Circle system utilized Preoxygenation: Pre-oxygenation with 100% oxygen Induction Type: IV induction Ventilation: Mask ventilation without difficulty Laryngoscope Size: Mac and 3 Grade View: Grade I Tube type: Oral Tube size: 7.0 mm Number of attempts: 1 Airway Equipment and Method: Stylet Placement Confirmation: ETT inserted through vocal cords under direct vision Secured at: 20 cm Tube secured with: Tape Dental Injury: Teeth and Oropharynx as per pre-operative assessment

## 2017-09-22 NOTE — Anesthesia Post-op Follow-up Note (Signed)
Anesthesia QCDR form completed.        

## 2017-09-23 DIAGNOSIS — N8110 Cystocele, unspecified: Secondary | ICD-10-CM | POA: Diagnosis not present

## 2017-09-23 LAB — CBC
HEMATOCRIT: 30.5 % — AB (ref 35.0–47.0)
Hemoglobin: 10.3 g/dL — ABNORMAL LOW (ref 12.0–16.0)
MCH: 33 pg (ref 26.0–34.0)
MCHC: 33.8 g/dL (ref 32.0–36.0)
MCV: 97.6 fL (ref 80.0–100.0)
Platelets: 194 10*3/uL (ref 150–440)
RBC: 3.12 MIL/uL — AB (ref 3.80–5.20)
RDW: 14.4 % (ref 11.5–14.5)
WBC: 9.3 10*3/uL (ref 3.6–11.0)

## 2017-09-23 LAB — BASIC METABOLIC PANEL
ANION GAP: 8 (ref 5–15)
BUN: 10 mg/dL (ref 6–20)
CO2: 23 mmol/L (ref 22–32)
Calcium: 8.4 mg/dL — ABNORMAL LOW (ref 8.9–10.3)
Chloride: 107 mmol/L (ref 101–111)
Creatinine, Ser: 0.75 mg/dL (ref 0.44–1.00)
GFR calc non Af Amer: >60 mL/min (ref >60)
GLUCOSE: 137 mg/dL — AB (ref 65–99)
POTASSIUM: 4.2 mmol/L (ref 3.5–5.1)
Sodium: 138 mmol/L (ref 135–145)

## 2017-09-23 MED ORDER — IBUPROFEN 800 MG PO TABS: 800.0000 mg | ORAL_TABLET | Freq: Three times a day (TID) | ORAL | 0 refills | Status: DC | PRN

## 2017-09-23 MED ORDER — ONDANSETRON HCL 4 MG PO TABS: 4.0000 mg | ORAL_TABLET | Freq: Four times a day (QID) | ORAL | 0 refills | Status: DC | PRN

## 2017-09-23 MED ORDER — OXYCODONE-ACETAMINOPHEN 5-325 MG PO TABS: 1.0000 | ORAL_TABLET | ORAL | 0 refills | Status: DC | PRN

## 2017-09-23 MED ORDER — DOCUSATE SODIUM 100 MG PO CAPS: 100.0000 mg | ORAL_CAPSULE | Freq: Every day | ORAL | 2 refills | Status: AC | PRN

## 2017-09-23 NOTE — Discharge Summary (Signed)
Physician Discharge Summary  Patient ID: Denise Sanders MRN: [id number] DOB/AGE: 1956/01/21 62 y.o.  Admit date: 09/22/2017 Discharge date: 09/23/2017  Admission Diagnoses:pelvic organ prolapse  Discharge Diagnoses:  Active Problems:   Postoperative state   Discharged Condition: good  Hospital Course: underwent a TVH , bilateral salpingectomy and A+P repair . Passed voiding test POD#1  Consults: None  Significant Diagnostic Studies: labs:  Results for orders placed or performed during the hospital encounter of 09/22/17 (from the past 24 hour(s))  Glucose, capillary     Status: Abnormal   Collection Time: 09/22/17 10:43 AM  Result Value Ref Range   Glucose-Capillary 177 (H) 65 - 99 mg/dL  Glucose, capillary     Status: Abnormal   Collection Time: 09/22/17  4:28 PM  Result Value Ref Range   Glucose-Capillary 222 (H) 65 - 99 mg/dL  CBC     Status: Abnormal   Collection Time: 09/23/17  6:23 AM  Result Value Ref Range   WBC 9.3 3.6 - 11.0 K/uL   RBC 3.12 (L) 3.80 - 5.20 MIL/uL   Hemoglobin 10.3 (L) 12.0 - 16.0 g/dL   HCT 30.5 (L) 35.0 - 47.0 %   MCV 97.6 80.0 - 100.0 fL   MCH 33.0 26.0 - 34.0 pg   MCHC 33.8 32.0 - 36.0 g/dL   RDW 14.4 11.5 - 14.5 %   Platelets 194 150 - 440 K/uL  Basic metabolic panel     Status: Abnormal   Collection Time: 09/23/17  6:23 AM  Result Value Ref Range   Sodium 138 135 - 145 mmol/L   Potassium 4.2 3.5 - 5.1 mmol/L   Chloride 107 101 - 111 mmol/L   CO2 23 22 - 32 mmol/L   Glucose, Bld 137 (H) 65 - 99 mg/dL   BUN 10 6 - 20 mg/dL   Creatinine, Ser 0.75 0.44 - 1.00 mg/dL   Calcium 8.4 (L) 8.9 - 10.3 mg/dL   GFR calc non Af Amer >60 >60 mL/min   GFR calc Af Amer >60 >60 mL/min   Anion gap 8 5 - 15    Treatments: surgery: as above  Discharge Exam: Blood pressure 122/75, pulse 76, temperature 97.9 F (36.6 C), temperature source Oral, resp. rate 17, height 5\' 3"  (1.6 m), weight 73.5 kg (162 lb), SpO2 99 %. General appearance: alert and  cooperative Resp: clear to auscultation bilaterally Cardio: regular rate and rhythm, S1, S2 normal, no murmur, click, rub or gallop GI: soft, non-tender; bowel sounds normal; no masses,  no organomegaly  Disposition: Discharge disposition: 01-Home or Self Care       Discharge Instructions    Call MD for:   Complete by:  As directed    Heavy vaginal bleeding   Call MD for:  difficulty breathing, headache or visual disturbances   Complete by:  As directed    Call MD for:  extreme fatigue   Complete by:  As directed    Call MD for:  hives   Complete by:  As directed    Call MD for:  persistant dizziness or light-headedness   Complete by:  As directed    Call MD for:  persistant nausea and vomiting   Complete by:  As directed    Call MD for:  redness, tenderness, or signs of infection (pain, swelling, redness, odor or green/yellow discharge around incision site)   Complete by:  As directed    Call MD for:  severe uncontrolled pain   Complete by:  As directed    Call MD for:  temperature >100.4   Complete by:  As directed    Diet - low sodium heart healthy   Complete by:  As directed    Increase activity slowly   Complete by:  As directed      Allergies as of 09/23/2017   No Known Allergies     Medication List    STOP taking these medications   Estriol Powd     TAKE these medications   docusate sodium 100 MG capsule Commonly known as:  COLACE Take 1 capsule (100 mg total) by mouth daily as needed.   gabapentin 100 MG capsule Commonly known as:  NEURONTIN Take 200 mg by mouth at bedtime as needed (for cramping/pain.).   glipiZIDE 10 MG tablet Commonly known as:  GLUCOTROL Take 10 mg by mouth daily before breakfast.   ibuprofen 800 MG tablet Commonly known as:  ADVIL,MOTRIN Take 1 tablet (800 mg total) by mouth every 8 (eight) hours as needed.   levothyroxine 100 MCG tablet Commonly known as:  SYNTHROID, LEVOTHROID Take 100 mcg by mouth daily before  breakfast.   metFORMIN 500 MG 24 hr tablet Commonly known as:  GLUCOPHAGE-XR Take 500 mg by mouth 2 (two) times daily.   ondansetron 4 MG tablet Commonly known as:  ZOFRAN Take 1 tablet (4 mg total) by mouth every 6 (six) hours as needed for nausea.   oxyCODONE-acetaminophen 5-325 MG tablet Commonly known as:  PERCOCET/ROXICET Take 1 tablet by mouth every 4 (four) hours as needed for moderate pain ((when tolerating fluids)).      Follow-up Information    Schermerhorn, Gwen Her, MD Follow up in 2 week(s).   Specialty:  Obstetrics and Gynecology Why:  post op Contact information: [address] [place] [address] [phone]           Signed: Gwen Her Schermerhorn 09/23/2017, 9:28 AM

## 2017-09-23 NOTE — Progress Notes (Signed)
Min. Vag drainage noted during. tol diet well.  Foley d/c at 600 without difficulty. tol well

## 2017-09-23 NOTE — Discharge Instructions (Signed)
Cystoscopy, Care After Refer to this sheet in the next few weeks. These instructions provide you with information about caring for yourself after your procedure. Your health care provider may also give you more specific instructions. Your treatment has been planned according to current medical practices, but problems sometimes occur. Call your health care provider if you have any problems or questions after your procedure. What can I expect after the procedure? After the procedure, it is common to have:  Mild pain when you urinate. Pain should stop within a few minutes after you urinate. This may last for up to 1 week.  A small amount of blood in your urine for several days.  Feeling like you need to urinate but producing only a small amount of urine.  Follow these instructions at home:  Medicines  Take over-the-counter and prescription medicines only as told by your health care provider.  If you were prescribed an antibiotic medicine, take it as told by your health care provider. Do not stop taking the antibiotic even if you start to feel better. General instructions   Return to your normal activities as told by your health care provider. Ask your health care provider what activities are safe for you.  Do not drive for 24 hours if you received a sedative.  Watch for any blood in your urine. If the amount of blood in your urine increases, call your health care provider.  Follow instructions from your health care provider about eating or drinking restrictions.  If a tissue sample was removed for testing (biopsy) during your procedure, it is your responsibility to get your test results. Ask your health care provider or the department performing the test when your results will be ready.  Drink enough fluid to keep your urine clear or pale yellow.  Keep all follow-up visits as told by your health care provider. This is important. Contact a health care provider if:  You have pain that  gets worse or does not get better with medicine, especially pain when you urinate.  You have difficulty urinating. Get help right away if:  You have more blood in your urine.  You have blood clots in your urine.  You have abdominal pain.  You have a fever or chills.  You are unable to urinate. This information is not intended to replace advice given to you by your health care provider. Make sure you discuss any questions you have with your health care provider. Document Released: 10/26/2004 Document Revised: 09/14/2015 Document Reviewed: 02/23/2015 Elsevier Interactive Patient Education  2018 [place]. Anterior and Posterior Colporrhaphy and Sling Procedure, Care After This sheet gives you information about how to care for yourself after your procedure. Your health care provider may also give you more specific instructions. If you have problems or questions, contact your health care provider. What can I expect after the procedure? After the procedure, it is common to have:  Pain in the surgical area.  Vaginal discharge. You will need to use a sanitary pad during this time.  Fatigue.  Follow these instructions at home: Incision care  Follow instructions from your health care provider about how to take care of your incision. Make sure you: ? Wash your hands with soap and water before touching the incision area. If soap and water are not available, use hand sanitizer. ? Clean your incision as told by your health care provider. ? Leave stitches (sutures), skin glue, or adhesive strips in place. These skin closures may need to stay  in place for 2 weeks or longer. If adhesive strip edges start to loosen and curl up, you may trim the loose edges. Do not remove adhesive strips completely unless your health care provider tells you to do that.  Check your incision area every day for signs of infection. Check for: ? Redness, swelling, or pain. ? Fluid or blood. ? Warmth. ? Pus or a  bad smell.  Check your incision every day to make sure the incision area is not separating or opening.  Do not take baths, swim, or use a hot tub until your health care provider approves. You may shower.  Keep the area between your vagina and rectum (perineal area) clean and dry. Make sure you clean the area after each bowel movement and each time you urinate.  Ask your health care provider if you can take a sitz bath or sit in a tub of clean, warm water. Activity  Do gentle, daily activity as told by your health care provider. You may be told to take short walks every day and go farther each time. Ask your health care provider what activities are safe for you.  Limit stair climbing to once or twice a day in the first week, then slowly increase this activity.  Do not lift anything that is heavier than 10 lbs. (4.5 kg), or the limit that your health care provider tells you, until he or she says that it is safe. Avoid pushing or pulling motions.  Avoid standing for long periods of time.  Do not douche, use tampons, or have sex until your health care provider says it is okay.  Do not drive or use heavy machinery while taking prescription pain medicine. To prevent constipation  To prevent or treat constipation while you are taking prescription pain medicine, your health care provider may recommend that you: ? Take over-the-counter or prescription medicines. ? Eat foods that are high in fiber, such as fresh fruits and vegetables, whole grains, and beans. ? Drink enough fluid to keep your urine clear or pale yellow. ? Limit foods that are high in fat and processed sugars, such as fried and sweet foods. General instructions  You may be instructed to do pelvic floor exercises (kegels) as told by your health care provider.  Take over-the-counter and prescription medicines only as told by your health care provider.  Keep all follow-up visits as told by your health care provider. This is  important. Contact a health care provider if:  Medicine does not help your pain.  You have frequent or urgent urination, or you are unable to completely empty your bladder.  You feel a burning sensation when urinating.  You have fluid or blood coming from your incision.  You have pus or a bad smell coming from the incision.  Your incision feels warm to the touch.  You have redness, swelling, or pain around your incision. Get help right away if:  You have a fever or chills.  Your incision separates or opens.  You cannot urinate.  You have trouble breathing. Summary  After the procedure, it is common to have pain, fatigue, and discharge from the vagina.  Keep the area between your vagina and rectum (perineal area) clean and dry. Make sure you clean the area after each bowel movement and each time you urinate.  Follow instructions from your health care provider on any activity restrictions after the procedure. This information is not intended to replace advice given to you by your health care provider.  Make sure you discuss any questions you have with your health care provider. Document Released: 11/21/2003 Document Revised: 04/08/2016 Document Reviewed: 04/08/2016 Elsevier Interactive Patient Education  2017 [place]. Vaginal Hysterectomy, Care After Refer to this sheet in the next few weeks. These instructions provide you with information about caring for yourself after your procedure. Your health care provider may also give you more specific instructions. Your treatment has been planned according to current medical practices, but problems sometimes occur. Call your health care provider if you have any problems or questions after your procedure. What can I expect after the procedure? After the procedure, it is common to have:  Pain.  Soreness and numbness in your incision areas.  Vaginal bleeding and discharge.  Constipation.  Temporary problems emptying the  bladder.  Feelings of sadness or other emotions.  Follow these instructions at home: Medicines  Take over-the-counter and prescription medicines only as told by your health care provider.  If you were prescribed an antibiotic medicine, take it as told by your health care provider. Do not stop taking the antibiotic even if you start to feel better.  Do not drive or operate heavy machinery while taking prescription pain medicine. Activity  Return to your normal activities as told by your health care provider. Ask your health care provider what activities are safe for you.  Get regular exercise as told by your health care provider. You may be told to take short walks every day and go farther each time.  Do not lift anything that is heavier than 10 lb (4.5 kg). General instructions   Do not put anything in your vagina for 6 weeks after your surgery or as told by your health care provider. This includes tampons and douches.  Do not have sex until your health care provider says you can.  Do not take baths, swim, or use a hot tub until your health care provider approves.  Drink enough fluid to keep your urine clear or pale yellow.  Do not drive for 24 hours if you were given a sedative.  Keep all follow-up visits as told by your health care provider. This is important. Contact a health care provider if:  Your pain medicine is not helping.  You have a fever.  You have redness, swelling, or pain at your incision site.  You have blood, pus, or a bad-smelling discharge from your vagina.  You continue to have difficulty urinating. Get help right away if:  You have severe abdominal or back pain.  You have heavy bleeding from your vagina.  You have chest pain or shortness of breath. This information is not intended to replace advice given to you by your health care provider. Make sure you discuss any questions you have with your health care provider. Document Released:  07/31/2015 Document Revised: 09/14/2015 Document Reviewed: 04/23/2015 Elsevier Interactive Patient Education  [employer].

## 2017-09-23 NOTE — Progress Notes (Signed)
Pt and son given all discharge instructions and understand all.  Pt to make her f/u appt for 2weeks. Rx's called to pharmacy by MD. Pt discharged home with son via wheelchair by auxillary staff.

## 2017-09-23 NOTE — Anesthesia Postprocedure Evaluation (Signed)
Anesthesia Post Note  Patient: Denise Sanders  Procedure(s) Performed: HYSTERECTOMY VAGINAL (N/A ) BILATERAL SALPINGECTOMY (Bilateral ) ANTERIOR (CYSTOCELE) AND POSTERIOR REPAIR (RECTOCELE) (N/A )  Patient location during evaluation: Other Anesthesia Type: General Level of consciousness: awake and alert and oriented Pain management: pain level controlled Vital Signs Assessment: post-procedure vital signs reviewed and stable Respiratory status: spontaneous breathing Cardiovascular status: blood pressure returned to baseline Anesthetic complications: no     Last Vitals:  Vitals:   09/23/17 0408 09/23/17 0717  BP: 117/83 122/75  Pulse: 79 76  Resp: 18 17  Temp: 36.9 C 36.6 C  SpO2: 100% 99%    Last Pain:  Vitals:   09/23/17 1043  TempSrc:   PainSc: 5                  Tamelia Michalowski

## 2017-09-24 LAB — SURGICAL PATHOLOGY

## 2017-09-26 ENCOUNTER — Encounter: Payer: Self-pay | Admitting: Radiology

## 2017-09-26 ENCOUNTER — Ambulatory Visit
Admission: RE | Admit: 2017-09-26 | Discharge: 2017-09-26 | Disposition: A | Discharge status: Home / Self Care | Payer: BLUE CROSS/BLUE SHIELD | Source: Ambulatory Visit | Attending: Obstetrics and Gynecology | Admitting: Obstetrics and Gynecology

## 2017-09-26 DIAGNOSIS — Z1231 Encounter for screening mammogram for malignant neoplasm of breast: Secondary | ICD-10-CM | POA: Insufficient documentation

## 2017-12-15 ENCOUNTER — Encounter: Payer: Self-pay | Admitting: *Deleted

## 2017-12-16 ENCOUNTER — Encounter
Admission: RE | Disposition: A | Discharge status: Home / Self Care | Payer: Self-pay | Source: Ambulatory Visit | Attending: Internal Medicine

## 2017-12-16 ENCOUNTER — Ambulatory Visit
Admission: RE | Admit: 2017-12-16 | Discharge: 2017-12-16 | Disposition: A | Discharge status: Home / Self Care | Payer: BLUE CROSS/BLUE SHIELD | Source: Ambulatory Visit | Attending: Internal Medicine | Admitting: Internal Medicine

## 2017-12-16 ENCOUNTER — Encounter: Payer: Self-pay | Admitting: *Deleted

## 2017-12-16 ENCOUNTER — Ambulatory Visit: Payer: BLUE CROSS/BLUE SHIELD | Admitting: Anesthesiology

## 2017-12-16 DIAGNOSIS — K5730 Diverticulosis of large intestine without perforation or abscess without bleeding: Secondary | ICD-10-CM | POA: Diagnosis not present

## 2017-12-16 DIAGNOSIS — Z7984 Long term (current) use of oral hypoglycemic drugs: Secondary | ICD-10-CM | POA: Insufficient documentation

## 2017-12-16 DIAGNOSIS — Z8371 Family history of colonic polyps: Secondary | ICD-10-CM | POA: Diagnosis not present

## 2017-12-16 DIAGNOSIS — E039 Hypothyroidism, unspecified: Secondary | ICD-10-CM | POA: Insufficient documentation

## 2017-12-16 DIAGNOSIS — E119 Type 2 diabetes mellitus without complications: Secondary | ICD-10-CM | POA: Insufficient documentation

## 2017-12-16 DIAGNOSIS — K640 First degree hemorrhoids: Secondary | ICD-10-CM | POA: Insufficient documentation

## 2017-12-16 DIAGNOSIS — K621 Rectal polyp: Secondary | ICD-10-CM | POA: Diagnosis not present

## 2017-12-16 DIAGNOSIS — Z1211 Encounter for screening for malignant neoplasm of colon: Secondary | ICD-10-CM | POA: Insufficient documentation

## 2017-12-16 DIAGNOSIS — Z79899 Other long term (current) drug therapy: Secondary | ICD-10-CM | POA: Insufficient documentation

## 2017-12-16 HISTORY — PX: COLONOSCOPY WITH PROPOFOL: SHX5780

## 2017-12-16 LAB — GLUCOSE, CAPILLARY: GLUCOSE-CAPILLARY: 177 mg/dL — AB (ref 70–99)

## 2017-12-16 SURGERY — COLONOSCOPY WITH PROPOFOL
Anesthesia: General

## 2017-12-16 MED ORDER — EPHEDRINE SULFATE 50 MG/ML IJ SOLN
INTRAMUSCULAR | Status: DC | PRN
  Administered 2017-12-16: 5 mg via INTRAVENOUS

## 2017-12-16 MED ORDER — PROPOFOL 500 MG/50ML IV EMUL
INTRAVENOUS | Status: AC
  Filled 2017-12-16: qty 50

## 2017-12-16 MED ORDER — MIDAZOLAM HCL 2 MG/2ML IJ SOLN
INTRAMUSCULAR | Status: AC
  Filled 2017-12-16: qty 2

## 2017-12-16 MED ORDER — PROPOFOL 500 MG/50ML IV EMUL
INTRAVENOUS | Status: DC | PRN
  Administered 2017-12-16: 100 ug/kg/min via INTRAVENOUS

## 2017-12-16 MED ORDER — FENTANYL CITRATE (PF) 100 MCG/2ML IJ SOLN
INTRAMUSCULAR | Status: DC | PRN
  Administered 2017-12-16: 50 ug via INTRAVENOUS

## 2017-12-16 MED ORDER — EPHEDRINE SULFATE 50 MG/ML IJ SOLN
INTRAMUSCULAR | Status: AC
  Filled 2017-12-16: qty 1

## 2017-12-16 MED ORDER — SODIUM CHLORIDE 0.9 % IV SOLN
INTRAVENOUS | Status: DC
  Administered 2017-12-16: 1000 mL via INTRAVENOUS

## 2017-12-16 MED ORDER — FENTANYL CITRATE (PF) 100 MCG/2ML IJ SOLN
INTRAMUSCULAR | Status: AC
  Filled 2017-12-16: qty 2

## 2017-12-16 MED ORDER — MIDAZOLAM HCL 2 MG/2ML IJ SOLN
INTRAMUSCULAR | Status: DC | PRN
  Administered 2017-12-16: 2 mg via INTRAVENOUS

## 2017-12-16 NOTE — Anesthesia Postprocedure Evaluation (Signed)
Anesthesia Post Note  Patient: Denise Sanders  Procedure(s) Performed: COLONOSCOPY WITH PROPOFOL (N/A )  Patient location during evaluation: Endoscopy Anesthesia Type: General Level of consciousness: awake and alert Pain management: pain level controlled Vital Signs Assessment: post-procedure vital signs reviewed and stable Respiratory status: spontaneous breathing, nonlabored ventilation, respiratory function stable and patient connected to nasal cannula oxygen Cardiovascular status: blood pressure returned to baseline and stable Postop Assessment: no apparent nausea or vomiting Anesthetic complications: no     Last Vitals:  Vitals:   12/16/17 0828 12/16/17 0830  BP: 128/68   Pulse: 79   Resp: (!) 21 (!) 21  Temp:    SpO2: 97%     Last Pain:  Vitals:   12/16/17 0828  TempSrc:   PainSc: 0-No pain                 Jaedin Trumbo S

## 2017-12-16 NOTE — Transfer of Care (Signed)
Immediate Anesthesia Transfer of Care Note  Patient: Denise Sanders  Procedure(s) Performed: COLONOSCOPY WITH PROPOFOL (N/A )  Patient Location: PACU  Anesthesia Type:General  Level of Consciousness: awake and sedated  Airway & Oxygen Therapy: Patient Spontanous Breathing and Patient connected to nasal cannula oxygen  Post-op Assessment: Report given to RN and Post -op Vital signs reviewed and stable  Post vital signs: Reviewed and stable  Last Vitals:  Vitals Value Taken Time  BP    Temp    Pulse    Resp    SpO2      Last Pain:  Vitals:   12/16/17 0710  TempSrc: Tympanic  PainSc: 0-No pain         Complications: No apparent anesthesia complications

## 2017-12-16 NOTE — Interval H&P Note (Signed)
History and Physical Interval Note:  12/16/2017 7:50 AM  Denise Sanders  has presented today for surgery, with the diagnosis of Personal History Colon Polyps  The various methods of treatment have been discussed with the patient and family. After consideration of risks, benefits and other options for treatment, the patient has consented to  Procedure(s) with comments: COLONOSCOPY WITH PROPOFOL (N/A) - (+) DM - oral as a surgical intervention .  The patient's history has been reviewed, patient examined, no change in status, stable for surgery.  I have reviewed the patient's chart and labs.  Questions were answered to the patient's satisfaction.     [place], [place]

## 2017-12-16 NOTE — Op Note (Signed)
[facility] Gastroenterology Patient Name: Denise Sanders Procedure Date: 12/16/2017 7:35 AM MRN: [id number] Account #: [id number] Date of Birth: 1956/04/09 Admit Type: Outpatient Age: 62 Room: [facility] ENDO ROOM 3 Gender: Female Note Status: Finalized Procedure:            Colonoscopy Indications:          Colon cancer screening in patient at increased risk:                        Family history of 1st-degree relative with colon polyps Providers:            Benay Pike. Alice Reichert MD, MD Referring MD:         Boykin Nearing, MD (Referring MD) Medicines:            Propofol per Anesthesia Complications:        No immediate complications. Procedure:            Pre-Anesthesia Assessment:                       - The risks and benefits of the procedure and the                        sedation options and risks were discussed with the                        patient. All questions were answered and informed                        consent was obtained.                       - Patient identification and proposed procedure were                        verified prior to the procedure by the nurse. The                        procedure was verified in the procedure room.                       - ASA Grade Assessment: III - A patient with severe                        systemic disease.                       - After reviewing the risks and benefits, the patient                        was deemed in satisfactory condition to undergo the                        procedure.                       After obtaining informed consent, the colonoscope was                        passed under direct vision. Throughout the procedure,  the patient's blood pressure, pulse, and oxygen                        saturations were monitored continuously. The                        Colonoscope was introduced through the anus and                        advanced to the the cecum, identified  by appendiceal                        orifice and ileocecal valve. The colonoscopy was                        performed without difficulty. The patient tolerated the                        procedure well. The quality of the bowel preparation                        was excellent. The ileocecal valve, appendiceal                        orifice, and rectum were photographed. Findings:      The perianal and digital rectal examinations were normal. Pertinent       negatives include normal sphincter tone and no palpable rectal lesions.      Many small-mouthed diverticula were found in the entire colon.      A 3 mm polyp was found in the rectum. The polyp was sessile. The polyp       was removed with a jumbo cold forceps. Resection and retrieval were       complete.      Non-bleeding internal hemorrhoids were found during retroflexion. The       hemorrhoids were Grade I (internal hemorrhoids that do not prolapse).      The exam was otherwise without abnormality. Impression:           - Diverticulosis in the entire examined colon.                       - One 3 mm polyp in the rectum, removed with a jumbo                        cold forceps. Resected and retrieved.                       - Non-bleeding internal hemorrhoids.                       - The examination was otherwise normal. Recommendation:       - Patient has a contact number available for                        emergencies. The signs and symptoms of potential                        delayed complications were discussed with the patient.  Return to normal activities tomorrow. Written discharge                        instructions were provided to the patient.                       - Resume previous diet.                       - Continue present medications.                       - Repeat colonoscopy in 5 years for screening purposes.                       - The findings and recommendations were discussed with                         the patient and their family.                       - Return to GI office PRN. Procedure Code(s):    --- Professional ---                       [phone], Colonoscopy, flexible; with biopsy, single or                        multiple Diagnosis Code(s):    --- Professional ---                       K57.30, Diverticulosis of large intestine without                        perforation or abscess without bleeding                       K62.1, Rectal polyp                       K64.0, First degree hemorrhoids                       Z83.71, Family history of colonic polyps CPT copyright 2017 American Medical Association. All rights reserved. The codes documented in this report are preliminary and upon coder review may  be revised to meet current compliance requirements. Efrain Sella MD, MD 12/16/2017 8:07:36 AM This report has been signed electronically. Number of Addenda: 0 Note Initiated On: 12/16/2017 7:35 AM Scope Withdrawal Time: 0 hours 6 minutes 24 seconds  Total Procedure Duration: 0 hours 8 minutes 25 seconds       [facility]

## 2017-12-16 NOTE — Anesthesia Procedure Notes (Signed)
Performed by: Cook-Martin, Maryssa Giampietro Pre-anesthesia Checklist: Patient identified, Emergency Drugs available, Suction available, Patient being monitored and Timeout performed Patient Re-evaluated:Patient Re-evaluated prior to induction Oxygen Delivery Method: Nasal cannula Preoxygenation: Pre-oxygenation with 100% oxygen Induction Type: IV induction Placement Confirmation: positive ETCO2 and CO2 detector       

## 2017-12-16 NOTE — Anesthesia Preprocedure Evaluation (Signed)
Anesthesia Evaluation  Patient identified by MRN, date of birth, ID band Patient awake    Reviewed: Allergy & Precautions, NPO status , Patient's Chart, lab work & pertinent test results, reviewed documented beta blocker date and time   Airway Mallampati: II  TM Distance: >3 FB     Dental  (+) Chipped   Pulmonary Current Smoker,           Cardiovascular      Neuro/Psych    GI/Hepatic   Endo/Other  diabetes, Type 2Hypothyroidism   Renal/GU      Musculoskeletal   Abdominal   Peds  Hematology   Anesthesia Other Findings   Reproductive/Obstetrics                             Anesthesia Physical Anesthesia Plan  ASA: II  Anesthesia Plan: General   Post-op Pain Management:    Induction: Intravenous  PONV Risk Score and Plan:   Airway Management Planned:   Additional Equipment:   Intra-op Plan:   Post-operative Plan:   Informed Consent: I have reviewed the patients History and Physical, chart, labs and discussed the procedure including the risks, benefits and alternatives for the proposed anesthesia with the patient or authorized representative who has indicated his/her understanding and acceptance.     Plan Discussed with: CRNA  Anesthesia Plan Comments:         Anesthesia Quick Evaluation

## 2017-12-16 NOTE — Anesthesia Post-op Follow-up Note (Signed)
Anesthesia QCDR form completed.        

## 2017-12-16 NOTE — H&P (Signed)
Outpatient short stay form Pre-procedure 12/16/2017 7:49 AM Denise Sanders, M.D.  Primary Physician: Laverta Baltimore, M.D.  Reason for visit:  Family history of colon polyps  History of present illness:  Patient presents for colon cancer screening for family history of colon polyps. The patient denies abdominal pain, abnormal weight loss or rectal bleeding.     Current Facility-Administered Medications:  .  0.9 %  sodium chloride infusion, , Intravenous, Continuous, [place], Benay Pike, MD, Last Rate: 20 mL/hr at 12/16/17 0720, 1,000 mL at 12/16/17 0720  Medications Prior to Admission  Medication Sig Dispense Refill Last Dose  . albuterol (PROVENTIL HFA;VENTOLIN HFA) 108 (90 Base) MCG/ACT inhaler Inhale into the lungs every 6 (six) hours as needed for wheezing or shortness of breath.     . docusate sodium (COLACE) 100 MG capsule Take 1 capsule (100 mg total) by mouth daily as needed. 30 capsule 2   . gabapentin (NEURONTIN) 100 MG capsule Take 200 mg by mouth at bedtime as needed (for cramping/pain.).    Past Week at Unknown time  . glipiZIDE (GLUCOTROL) 10 MG tablet Take 10 mg by mouth daily before breakfast.   Past Week at Unknown time  . ibuprofen (ADVIL,MOTRIN) 800 MG tablet Take 1 tablet (800 mg total) by mouth every 8 (eight) hours as needed. 30 tablet 0   . levothyroxine (SYNTHROID, LEVOTHROID) 100 MCG tablet Take 100 mcg by mouth daily before breakfast.  11 09/22/2017 at Unknown time  . metFORMIN (GLUCOPHAGE-XR) 500 MG 24 hr tablet Take 500 mg by mouth 2 (two) times daily.  1 Past Week at Unknown time  . ondansetron (ZOFRAN) 4 MG tablet Take 1 tablet (4 mg total) by mouth every 6 (six) hours as needed for nausea. 20 tablet 0      No Known Allergies   Past Medical History:  Diagnosis Date  . Diabetes mellitus without complication ([place])   . Hypothyroidism     Review of systems:  Otherwise negative.    Physical Exam  Gen: Alert, oriented. Appears stated age.  HEENT:  [place]/AT. PERRLA. Lungs: CTA, no wheezes. CV: RR nl S1, S2. Abd: soft, benign, no masses. BS+ Ext: No edema. Pulses 2+    Planned procedures: Proceed with colonoscopy. The patient understands the nature of the planned procedure, indications, risks, alternatives and potential complications including but not limited to bleeding, infection, perforation, damage to internal organs and possible oversedation/side effects from anesthesia. The patient agrees and gives consent to proceed.  Please refer to procedure notes for findings, recommendations and patient disposition/instructions.     Denise Sanders, M.D. Gastroenterology 12/16/2017  7:49 AM

## 2017-12-17 LAB — SURGICAL PATHOLOGY

## 2017-12-18 ENCOUNTER — Encounter: Payer: Self-pay | Admitting: Internal Medicine

## 2018-12-25 ENCOUNTER — Telehealth: Payer: Self-pay | Admitting: *Deleted

## 2018-12-25 DIAGNOSIS — Z122 Encounter for screening for malignant neoplasm of respiratory organs: Secondary | ICD-10-CM

## 2018-12-25 DIAGNOSIS — Z87891 Personal history of nicotine dependence: Secondary | ICD-10-CM

## 2018-12-25 NOTE — Telephone Encounter (Signed)
Received referral for initial lung cancer screening scan. Contacted patient and obtained smoking history,(current, 39 pack year) as well as answering questions related to screening process. Patient denies signs of lung cancer such as weight loss or hemoptysis. Patient denies comorbidity that would prevent curative treatment if lung cancer were found. Patient is scheduled for shared decision making visit and CT scan on 01/08/19 at 1115am.

## 2019-01-08 ENCOUNTER — Other Ambulatory Visit: Payer: Self-pay

## 2019-01-08 ENCOUNTER — Encounter: Payer: Self-pay | Admitting: Oncology

## 2019-01-08 ENCOUNTER — Inpatient Hospital Stay: Payer: Managed Care, Other (non HMO) | Attending: Oncology | Admitting: Oncology

## 2019-01-08 ENCOUNTER — Ambulatory Visit
Admission: RE | Admit: 2019-01-08 | Discharge: 2019-01-08 | Disposition: A | Discharge status: Home / Self Care | Payer: Managed Care, Other (non HMO) | Source: Ambulatory Visit | Attending: Oncology | Admitting: Oncology

## 2019-01-08 DIAGNOSIS — Z122 Encounter for screening for malignant neoplasm of respiratory organs: Secondary | ICD-10-CM | POA: Insufficient documentation

## 2019-01-08 DIAGNOSIS — Z87891 Personal history of nicotine dependence: Secondary | ICD-10-CM | POA: Diagnosis present

## 2019-01-08 NOTE — Progress Notes (Signed)
Virtual Visit via Video Note  I connected with Denise Sanders on 01/08/19 at 11:15 AM EDT by a video enabled telemedicine application and verified that I am speaking with the correct person using two identifiers.  Location: Patient: [facility] Provider: Office   I discussed the limitations of evaluation and management by telemedicine and the availability of in person appointments. The patient expressed understanding and agreed to proceed.  I discussed the assessment and treatment plan with the patient. The patient was provided an opportunity to ask questions and all were answered. The patient agreed with the plan and demonstrated an understanding of the instructions.   The patient was advised to call back or seek an in-person evaluation if the symptoms worsen or if the condition fails to improve as anticipated.   In accordance with CMS guidelines, patient has met eligibility criteria including age, absence of signs or symptoms of lung cancer.  Social History   Tobacco Use  . Smoking status: Current Every Day Smoker    Packs/day: 1.00    Years: 39.00    Pack years: 39.00    Types: Cigarettes  . Smokeless tobacco: Never Used  Substance Use Topics  . Alcohol use: Yes    Alcohol/week: 7.0 standard drinks    Types: 7 Glasses of wine per week  . Drug use: No      A shared decision-making session was conducted prior to the performance of CT scan. This includes one or more decision aids, includes benefits and harms of screening, follow-up diagnostic testing, over-diagnosis, false positive rate, and total radiation exposure.   Counseling on the importance of adherence to annual lung cancer LDCT screening, impact of co-morbidities, and ability or willingness to undergo diagnosis and treatment is imperative for compliance of the program.   Counseling on the importance of continued smoking cessation for former smokers; the importance of smoking cessation for current smokers, and information about  tobacco cessation interventions have been given to patient including [place] and [employer] quit [place] programs.   Written order for lung cancer screening with LDCT has been given to the patient and any and all questions have been answered to the best of my abilities.    Yearly follow up will be coordinated by Burgess Estelle, Thoracic Navigator.  I provided 15 minutes of face-to-face video visit time during this encounter, and > 50% was spent counseling as documented under my assessment & plan.   Jacquelin Hawking, NP

## 2019-01-12 ENCOUNTER — Encounter: Payer: Self-pay | Admitting: *Deleted

## 2019-02-15 ENCOUNTER — Other Ambulatory Visit: Payer: Self-pay | Admitting: Obstetrics and Gynecology

## 2019-02-15 DIAGNOSIS — Z1231 Encounter for screening mammogram for malignant neoplasm of breast: Secondary | ICD-10-CM

## 2019-05-07 ENCOUNTER — Ambulatory Visit
Admission: RE | Admit: 2019-05-07 | Discharge: 2019-05-07 | Disposition: A | Discharge status: Home / Self Care | Payer: BC Managed Care – PPO | Source: Ambulatory Visit | Attending: Obstetrics and Gynecology | Admitting: Obstetrics and Gynecology

## 2019-05-07 DIAGNOSIS — Z1231 Encounter for screening mammogram for malignant neoplasm of breast: Secondary | ICD-10-CM

## 2019-12-18 ENCOUNTER — Telehealth: Payer: Self-pay | Admitting: *Deleted

## 2019-12-18 NOTE — Telephone Encounter (Signed)
Pt notified that lung cancer screening imaging is due currently or in the near future. Patient is a current smoker, and smoke a pack of cigarettes per day. Appointment made for 01/13/2020 @ 3:30 pm.

## 2019-12-28 ENCOUNTER — Other Ambulatory Visit: Payer: Self-pay | Admitting: *Deleted

## 2019-12-28 DIAGNOSIS — Z122 Encounter for screening for malignant neoplasm of respiratory organs: Secondary | ICD-10-CM

## 2019-12-28 DIAGNOSIS — Z87891 Personal history of nicotine dependence: Secondary | ICD-10-CM

## 2020-01-13 ENCOUNTER — Ambulatory Visit: Admission: RE | Admit: 2020-01-13 | Payer: BC Managed Care – PPO | Source: Ambulatory Visit

## 2020-01-28 ENCOUNTER — Ambulatory Visit
Admission: RE | Admit: 2020-01-28 | Discharge: 2020-01-28 | Disposition: A | Discharge status: Home / Self Care | Payer: BC Managed Care – PPO | Source: Ambulatory Visit | Attending: Oncology | Admitting: Oncology

## 2020-01-28 ENCOUNTER — Other Ambulatory Visit: Payer: Self-pay

## 2020-01-28 DIAGNOSIS — Z87891 Personal history of nicotine dependence: Secondary | ICD-10-CM

## 2020-01-28 DIAGNOSIS — Z122 Encounter for screening for malignant neoplasm of respiratory organs: Secondary | ICD-10-CM

## 2020-02-02 ENCOUNTER — Encounter: Payer: Self-pay | Admitting: *Deleted

## 2020-04-22 HISTORY — PX: BREAST BIOPSY: SHX20

## 2020-06-12 ENCOUNTER — Other Ambulatory Visit: Payer: Self-pay | Admitting: Obstetrics and Gynecology

## 2020-08-16 NOTE — Patient Instructions (Signed)
JOVANNI ECKHART  08/16/2020     @PREFPERIOPPHARMACY @   Your procedure is scheduled on  08/25/2020.   Report to [facility] at  0730  A.M.   Call this number if you have problems the morning of surgery:  [phone]   Remember:  Do not eat or drink after midnight.                         Take these medicines the morning of surgery with A SIP OF WATER  Gabapentin, levothyroxine.  DO NOT take any medications for diabetes the morning of your procedure.   If your glucose is 70 or below the morning of your procedure, drink 1/2 cup of clear liquid containing sugar and recheck your glucose in 15 minutes. If your glucose is still 70 or below, call [phone] for instructions.  If your glucose is 300 or above the morning of your procedure, call [phone] for instructions.     Please brush your teeth.  Do not wear jewelry, make-up or nail polish.  Do not wear lotions, powders, or perfumes, or deodorant.  Do not shave 48 hours prior to surgery.  Men may shave face and neck.  Do not bring valuables to the hospital.  [facility] is not responsible for any belongings or valuables.  Contacts, dentures or bridgework may not be worn into surgery.  Leave your suitcase in the car.  After surgery it may be brought to your room.  For patients admitted to the hospital, discharge time will be determined by your treatment team.  Patients discharged the day of surgery will not be allowed to drive home and must have someone with them for 24 hours.   Special instructions:  DO NOT smoke tobacco or vape for 24 hours before your procedure.  Please read over the following fact sheets that you were given. Care and Recovery After Surgery      Cataract Surgery, Care After This sheet gives you information about how to care for yourself after your procedure. Your health care provider may also give you more specific instructions. If you have problems or questions, contact your health care  provider. What can I expect after the procedure? After the procedure, it is common to have:  Itching.  Discomfort.  Fluid discharge.  Sensitivity to light and to touch.  Bruising in or around the eye.  Mild blurred vision. Follow these instructions at home: Eye care  Do not touch or rub your eyes.  Protect your eyes as told by your health care provider. You may be told to wear a protective eye shield or sunglasses.  Do not put a contact lens into the affected eye or eyes until your health care provider approves.  Keep the area around your eye clean and dry: ? Avoid swimming. ? Do not allow water to hit you directly in the face while showering. ? Keep soap and shampoo out of your eyes.  Check your eye every day for signs of infection. Watch for: ? Redness, swelling, or pain. ? Fluid, blood, or pus. ? Warmth. ? A bad smell. ? Vision that is getting worse. ? Sensitivity that is getting worse.   Activity  Do not drive for 24 hours if you were given a sedative during your procedure.  Avoid strenuous activities, such as playing contact sports, for as long as told by your health care provider.  Do not drive or use heavy machinery until your  health care provider approves.  Do not bend or lift heavy objects. Bending increases pressure in the eye. You can walk, climb stairs, and do light household chores.  Ask your health care provider when you can return to work. If you work in a dusty environment, you may be advised to wear protective eyewear for a period of time. General instructions  Take or apply over-the-counter and prescription medicines only as told by your health care provider. This includes eye drops.  Keep all follow-up visits as told by your health care provider. This is important. Contact a health care provider if:  You have increased bruising around your eye.  You have pain that is not helped with medicine.  You have a fever.  You have redness,  swelling, or pain in your eye.  You have fluid, blood, or pus coming from your incision.  Your vision gets worse.  Your sensitivity to light gets worse. Get help right away if:  You have sudden loss of vision.  You see flashes of light or spots (floaters).  You have severe eye pain.  You develop nausea or vomiting. Summary  After your procedure, it is common to have itching, discomfort, bruising, fluid discharge, or sensitivity to light.  Follow instructions from your health care provider about caring for your eye after the procedure.  Do not rub your eye after the procedure. You may need to wear eye protection or sunglasses. Do not wear contact lenses. Keep the area around your eye clean and dry.  Avoid activities that require a lot of effort. These include playing sports and lifting heavy objects.  Contact a health care provider if you have increased bruising, pain that does not go away, or a fever. Get help right away if you suddenly lose your vision, see flashes of light or spots, or have severe pain in the eye. This information is not intended to replace advice given to you by your health care provider. Make sure you discuss any questions you have with your health care provider. Document Revised: 02/02/2019 Document Reviewed: 10/06/2017 Elsevier Patient Education  [place].

## 2020-08-16 NOTE — H&P (Signed)
Surgical History & Physical  Patient Name: Denise Sanders DOB: May 14, 1955  Surgery: Cataract extraction with intraocular lens implant phacoemulsification; Right Eye  Surgeon: Baruch Goldmann MD Surgery Date:  08/25/2020 Pre-Op Date:  08/14/2020  HPI: A 73 Yr. old female patient is referred by Dr Hassell Done for cataract eval. 1. The patient complains of difficulty when driving due to glare from headlights or sun, which began 3 months ago. Both eyes are affected. The episode is gradual. The condition's severity increased since last visit. Symptoms occur when the patient is driving and outside. This is negatively affecting her quality of life. HPI Completed by Dr. Baruch Goldmann  Medical History: Cataracts Degenerative Disorder of Macula Diabetes Hypothyroidism Thyroid Problems  Review of Systems Negative Allergic/Immunologic Negative Cardiovascular Negative Constitutional Negative Ear, Nose, Mouth & Throat Negative Endocrine Negative Eyes Negative Gastrointestinal Negative Genitourinary Negative Hemotologic/Lymphatic Negative Integumentary Negative Musculoskeletal Negative Neurological Negative Psychiatry Negative Respiratory  Social   Current every day smoker  Medication Atorvastatin, Diclofenac Sodium, Glipizide, Levothyroxine Sodium, Metformin, Ozempic,   Sx/Procedures Hysterectomy, Tracheotomy,   Drug Allergies   NKDA  History & Physical: Heent:  Cataract, Right eye NECK: supple without bruits LUNGS: lungs clear to auscultation CV: regular rate and rhythm Abdomen: soft and non-tender  Impression & Plan: Assessment: 1.  NUCLEAR SCLEROSIS AGE RELATED; Both Eyes (H25.13) 2.  Diabetes Type 2 No retinopathy (E11.9) 3.  BLEPHARITIS; Right Upper Lid, Right Lower Lid, Left Upper Lid, Left Lower Lid (H01.001, H01.002,H01.004,H01.005) 4.  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  ASTIGMATISM, REGULAR; Both Eyes (H52.223)  Plan: 1.  Cataract accounts for the patient's  decreased vision. This visual impairment is not correctable with a tolerable change in glasses or contact lenses. Cataract surgery with an implantation of a new lens should significantly improve the visual and functional status of the patient. Discussed all risks, benefits, alternatives, and potential complications. Discussed the procedures and recovery. Patient desires to have surgery. A-scan ordered and performed today for intra-ocular lens calculations. The surgery will be performed in order to improve vision for driving, reading, and for eye examinations. Recommend phacoemulsification with intra-ocular lens. Recommend Dextenza for post-operative pain and inflammation. Right Eye worse - first. Goal -3.00. Patient desires monovision. Dilates well - shugarcaine by protocol. Toric Lens. 2.  Stressed importance of blood sugar and blood pressure control, and also yearly eye examinations. Discussed the need for ongoing proactive ocular exams and treatment, hopefully before visual symptoms develop. 3.  recommend regular lid cleaning 4.  small, appears benign with no lash loss or ulceration 5.  Both eyes- right worse. Recommend toric lens OD.

## 2020-08-18 ENCOUNTER — Encounter (HOSPITAL_COMMUNITY): Payer: Self-pay

## 2020-08-18 ENCOUNTER — Other Ambulatory Visit: Payer: Self-pay

## 2020-08-18 ENCOUNTER — Encounter (HOSPITAL_COMMUNITY)
Admission: RE | Admit: 2020-08-18 | Discharge: 2020-08-18 | Disposition: A | Discharge status: Home / Self Care | Payer: BC Managed Care – PPO | Source: Ambulatory Visit | Attending: Ophthalmology | Admitting: Ophthalmology

## 2020-08-18 HISTORY — DX: Unspecified osteoarthritis, unspecified site: M19.90

## 2020-08-23 ENCOUNTER — Other Ambulatory Visit: Payer: Self-pay

## 2020-08-23 ENCOUNTER — Other Ambulatory Visit (HOSPITAL_COMMUNITY)
Admission: RE | Admit: 2020-08-23 | Discharge: 2020-08-23 | Disposition: A | Discharge status: Home / Self Care | Payer: BC Managed Care – PPO | Source: Ambulatory Visit | Attending: Ophthalmology | Admitting: Ophthalmology

## 2020-08-23 DIAGNOSIS — F17200 Nicotine dependence, unspecified, uncomplicated: Secondary | ICD-10-CM | POA: Diagnosis not present

## 2020-08-23 DIAGNOSIS — E1136 Type 2 diabetes mellitus with diabetic cataract: Secondary | ICD-10-CM | POA: Diagnosis not present

## 2020-08-23 DIAGNOSIS — Z79899 Other long term (current) drug therapy: Secondary | ICD-10-CM | POA: Diagnosis not present

## 2020-08-23 DIAGNOSIS — H02822 Cysts of right lower eyelid: Secondary | ICD-10-CM | POA: Diagnosis not present

## 2020-08-23 DIAGNOSIS — H0100B Unspecified blepharitis left eye, upper and lower eyelids: Secondary | ICD-10-CM | POA: Diagnosis not present

## 2020-08-23 DIAGNOSIS — H2513 Age-related nuclear cataract, bilateral: Secondary | ICD-10-CM | POA: Diagnosis not present

## 2020-08-23 DIAGNOSIS — Z20822 Contact with and (suspected) exposure to covid-19: Secondary | ICD-10-CM | POA: Insufficient documentation

## 2020-08-23 DIAGNOSIS — Z01812 Encounter for preprocedural laboratory examination: Secondary | ICD-10-CM | POA: Insufficient documentation

## 2020-08-23 DIAGNOSIS — Z79890 Hormone replacement therapy (postmenopausal): Secondary | ICD-10-CM | POA: Diagnosis not present

## 2020-08-23 DIAGNOSIS — Z7984 Long term (current) use of oral hypoglycemic drugs: Secondary | ICD-10-CM | POA: Diagnosis not present

## 2020-08-23 DIAGNOSIS — H0100A Unspecified blepharitis right eye, upper and lower eyelids: Secondary | ICD-10-CM | POA: Diagnosis not present

## 2020-08-23 DIAGNOSIS — H52223 Regular astigmatism, bilateral: Secondary | ICD-10-CM | POA: Diagnosis not present

## 2020-08-24 LAB — SARS CORONAVIRUS 2 (TAT 6-24 HRS): SARS Coronavirus 2: NEGATIVE

## 2020-08-25 ENCOUNTER — Encounter (HOSPITAL_COMMUNITY)
Admission: RE | Disposition: A | Discharge status: Home / Self Care | Payer: Self-pay | Source: Ambulatory Visit | Attending: Ophthalmology

## 2020-08-25 ENCOUNTER — Ambulatory Visit (HOSPITAL_COMMUNITY)
Admission: RE | Admit: 2020-08-25 | Discharge: 2020-08-25 | Disposition: A | Discharge status: Home / Self Care | Payer: BC Managed Care – PPO | Source: Ambulatory Visit | Attending: Ophthalmology | Admitting: Ophthalmology

## 2020-08-25 ENCOUNTER — Encounter (HOSPITAL_COMMUNITY): Payer: Self-pay | Admitting: Ophthalmology

## 2020-08-25 ENCOUNTER — Ambulatory Visit (HOSPITAL_COMMUNITY): Payer: BC Managed Care – PPO | Admitting: Anesthesiology

## 2020-08-25 DIAGNOSIS — Z7984 Long term (current) use of oral hypoglycemic drugs: Secondary | ICD-10-CM | POA: Insufficient documentation

## 2020-08-25 DIAGNOSIS — E1136 Type 2 diabetes mellitus with diabetic cataract: Secondary | ICD-10-CM | POA: Diagnosis not present

## 2020-08-25 DIAGNOSIS — H2513 Age-related nuclear cataract, bilateral: Secondary | ICD-10-CM | POA: Insufficient documentation

## 2020-08-25 DIAGNOSIS — Z79899 Other long term (current) drug therapy: Secondary | ICD-10-CM | POA: Insufficient documentation

## 2020-08-25 DIAGNOSIS — Z79890 Hormone replacement therapy (postmenopausal): Secondary | ICD-10-CM | POA: Insufficient documentation

## 2020-08-25 DIAGNOSIS — Z20822 Contact with and (suspected) exposure to covid-19: Secondary | ICD-10-CM | POA: Insufficient documentation

## 2020-08-25 DIAGNOSIS — H0100A Unspecified blepharitis right eye, upper and lower eyelids: Secondary | ICD-10-CM | POA: Insufficient documentation

## 2020-08-25 DIAGNOSIS — F17200 Nicotine dependence, unspecified, uncomplicated: Secondary | ICD-10-CM | POA: Insufficient documentation

## 2020-08-25 DIAGNOSIS — H0100B Unspecified blepharitis left eye, upper and lower eyelids: Secondary | ICD-10-CM | POA: Insufficient documentation

## 2020-08-25 DIAGNOSIS — H52223 Regular astigmatism, bilateral: Secondary | ICD-10-CM | POA: Insufficient documentation

## 2020-08-25 DIAGNOSIS — H02822 Cysts of right lower eyelid: Secondary | ICD-10-CM | POA: Insufficient documentation

## 2020-08-25 HISTORY — PX: CATARACT EXTRACTION W/PHACO: SHX586

## 2020-08-25 LAB — GLUCOSE, CAPILLARY: Glucose-Capillary: 129 mg/dL — ABNORMAL HIGH (ref 70–99)

## 2020-08-25 SURGERY — PHACOEMULSIFICATION, CATARACT, WITH IOL INSERTION
Anesthesia: Monitor Anesthesia Care | Site: Eye | Laterality: Right

## 2020-08-25 MED ORDER — EPINEPHRINE PF 1 MG/ML IJ SOLN
INTRAOCULAR | Status: DC | PRN
  Administered 2020-08-25: 500 mL

## 2020-08-25 MED ORDER — BSS IO SOLN
INTRAOCULAR | Status: DC | PRN
  Administered 2020-08-25: 15 mL via INTRAOCULAR

## 2020-08-25 MED ORDER — STERILE WATER FOR IRRIGATION IR SOLN
Status: DC | PRN
  Administered 2020-08-25: 250 mL

## 2020-08-25 MED ORDER — MIDAZOLAM HCL 2 MG/2ML IJ SOLN
INTRAMUSCULAR | Status: AC
  Filled 2020-08-25: qty 2

## 2020-08-25 MED ORDER — SODIUM HYALURONATE 10 MG/ML IO SOLUTION
PREFILLED_SYRINGE | INTRAOCULAR | Status: DC | PRN
  Administered 2020-08-25: 0.85 mL via INTRAOCULAR

## 2020-08-25 MED ORDER — NEOMYCIN-POLYMYXIN-DEXAMETH 3.5-10000-0.1 OP SUSP
OPHTHALMIC | Status: DC | PRN
  Administered 2020-08-25: 1 [drp] via OPHTHALMIC

## 2020-08-25 MED ORDER — LIDOCAINE HCL (PF) 1 % IJ SOLN
INTRAOCULAR | Status: DC | PRN
  Administered 2020-08-25: 1 mL via OPHTHALMIC

## 2020-08-25 MED ORDER — MIDAZOLAM HCL 2 MG/2ML IJ SOLN
INTRAMUSCULAR | Status: DC | PRN
  Administered 2020-08-25: 2 mg via INTRAVENOUS

## 2020-08-25 MED ORDER — POVIDONE-IODINE 5 % OP SOLN
OPHTHALMIC | Status: DC | PRN
  Administered 2020-08-25: 1 via OPHTHALMIC

## 2020-08-25 MED ORDER — EPINEPHRINE PF 1 MG/ML IJ SOLN
INTRAMUSCULAR | Status: AC
  Filled 2020-08-25: qty 2

## 2020-08-25 MED ORDER — SODIUM CHLORIDE 0.9% FLUSH
INTRAVENOUS | Status: DC | PRN
  Administered 2020-08-25: 3 mL via INTRAVENOUS

## 2020-08-25 MED ORDER — LIDOCAINE HCL 3.5 % OP GEL
1.0000 "application " | Freq: Once | OPHTHALMIC | Status: AC
  Administered 2020-08-25: 1 via OPHTHALMIC

## 2020-08-25 MED ORDER — TROPICAMIDE 1 % OP SOLN
1.0000 [drp] | OPHTHALMIC | Status: AC
  Administered 2020-08-25 (×3): 1 [drp] via OPHTHALMIC

## 2020-08-25 MED ORDER — TETRACAINE HCL 0.5 % OP SOLN
1.0000 [drp] | OPHTHALMIC | Status: AC | PRN
  Administered 2020-08-25 (×3): 1 [drp] via OPHTHALMIC

## 2020-08-25 MED ORDER — PHENYLEPHRINE HCL 2.5 % OP SOLN
1.0000 [drp] | OPHTHALMIC | Status: AC | PRN
  Administered 2020-08-25 (×3): 1 [drp] via OPHTHALMIC

## 2020-08-25 MED ORDER — SODIUM HYALURONATE 23MG/ML IO SOSY
PREFILLED_SYRINGE | INTRAOCULAR | Status: DC | PRN
  Administered 2020-08-25: 0.6 mL via INTRAOCULAR

## 2020-08-25 SURGICAL SUPPLY — 12 items
CLOTH BEACON ORANGE TIMEOUT ST (SAFETY) ×2 IMPLANT
EYE SHIELD UNIVERSAL CLEAR (GAUZE/BANDAGES/DRESSINGS) ×2 IMPLANT
GLOVE SURG UNDER POLY LF SZ6.5 (GLOVE) ×2 IMPLANT
GLOVE SURG UNDER POLY LF SZ7 (GLOVE) ×2 IMPLANT
NEEDLE HYPO 18GX1.5 BLUNT FILL (NEEDLE) ×2 IMPLANT
PAD ARMBOARD 7.5X6 YLW CONV (MISCELLANEOUS) ×2 IMPLANT
SYR TB 1ML LL NO SAFETY (SYRINGE) ×2 IMPLANT
TAPE SURG TRANSPORE 1 IN (GAUZE/BANDAGES/DRESSINGS) ×1 IMPLANT
TAPE SURGICAL TRANSPORE 1 IN (GAUZE/BANDAGES/DRESSINGS) ×1
Technis 1-Piece IOL (Intraocular Lens) ×2 IMPLANT
VISCOELASTIC ADDITIONAL (OPHTHALMIC RELATED) IMPLANT
WATER STERILE IRR 250ML POUR (IV SOLUTION) ×2 IMPLANT

## 2020-08-25 NOTE — Transfer of Care (Signed)
Immediate Anesthesia Transfer of Care Note  Patient: Denise Sanders  Procedure(s) Performed: CATARACT EXTRACTION PHACO AND INTRAOCULAR LENS PLACEMENT RIGHT EYE (Right Eye)  Patient Location: PACU  Anesthesia Type:MAC  Level of Consciousness: awake, alert , oriented and patient cooperative  Airway & Oxygen Therapy: Patient Spontanous Breathing  Post-op Assessment: Report given to RN and Post -op Vital signs reviewed and stable  Post vital signs: Reviewed and stable  Last Vitals:  Vitals Value Taken Time  BP 114/66 08/25/20 0922  Temp 36.7 C 08/25/20 0922  Pulse 75 08/25/20 0922  Resp 18 08/25/20 0922  SpO2 100 % 08/25/20 0922    Last Pain:  Vitals:   08/25/20 0922  TempSrc: Oral  PainSc: 0-No pain      Patients Stated Pain Goal: 6 (28/41/32 4401)  Complications: No complications documented.

## 2020-08-25 NOTE — Discharge Instructions (Signed)
Please discharge patient when stable, will follow up today with Dr. Burke Terry at the [facility] office immediately following discharge.  Leave shield in place until visit.  All paperwork with discharge instructions will be given at the office.  [facility] Address:  [address]  [place], [place] [address]   PATIENT INSTRUCTIONS POST-ANESTHESIA  IMMEDIATELY FOLLOWING SURGERY:  Do not drive or operate machinery for the first twenty four hours after surgery.  Do not make any important decisions for twenty four hours after surgery or while taking narcotic pain medications or sedatives.  If you develop intractable nausea and vomiting or a severe headache please notify your doctor immediately.  FOLLOW-UP:  Please make an appointment with your surgeon as instructed. You do not need to follow up with anesthesia unless specifically instructed to do so.  WOUND CARE INSTRUCTIONS (if applicable):  Keep a dry clean dressing on the anesthesia/puncture wound site if there is drainage.  Once the wound has quit draining you may leave it open to air.  Generally you should leave the bandage intact for twenty four hours unless there is drainage.  If the epidural site drains for more than 36-48 hours please call the anesthesia department.  QUESTIONS?:  Please feel free to call your physician or the hospital operator if you have any questions, and they will be happy to assist you.       

## 2020-08-25 NOTE — Anesthesia Postprocedure Evaluation (Signed)
Anesthesia Post Note  Patient: Denise Sanders  Procedure(s) Performed: CATARACT EXTRACTION PHACO AND INTRAOCULAR LENS PLACEMENT RIGHT EYE (Right Eye)  Anesthesia Type: MAC Level of consciousness: awake, awake and alert, oriented and patient cooperative Pain management: satisfactory to patient Vital Signs Assessment: post-procedure vital signs reviewed and stable Respiratory status: spontaneous breathing Cardiovascular status: stable Postop Assessment: no apparent nausea or vomiting Anesthetic complications: no   No complications documented.   Last Vitals:  Vitals:   08/25/20 0819 08/25/20 0922  BP:  114/66  Pulse:  75  Resp: 17 18  Temp: 36.5 C 36.7 C  SpO2: 100% 100%    Last Pain:  Vitals:   08/25/20 0922  TempSrc: Oral  PainSc: 0-No pain                 Adit Riddles

## 2020-08-25 NOTE — Interval H&P Note (Signed)
History and Physical Interval Note:  08/25/2020 8:56 AM  Denise Sanders  has presented today for surgery, with the diagnosis of Nuclear sclerotic cataract - Right eye.  The various methods of treatment have been discussed with the patient and family. After consideration of risks, benefits and other options for treatment, the patient has consented to  Procedure(s) with comments: CATARACT EXTRACTION PHACO AND INTRAOCULAR LENS PLACEMENT (IOC) (Right) - right as a surgical intervention.  The patient's history has been reviewed, patient examined, no change in status, stable for surgery.  I have reviewed the patient's chart and labs.  Questions were answered to the patient's satisfaction.     Baruch Goldmann

## 2020-08-25 NOTE — Op Note (Signed)
Date of procedure: 08/25/20  Pre-operative diagnosis: Visually significant age-related nuclear cataract, Right Eye (H25.11)  Post-operative diagnosis: Visually significant age-related nuclear cataract, Right Eye  Procedure: Removal of cataract via phacoemulsification and insertion of intra-ocular lens Wynetta Emery and [employer] DCB00  +20.0D into the capsular bag of the Right Eye  Attending surgeon: Gerda Diss. Silvia Markuson, MD, MA  Anesthesia: MAC, Topical Akten  Complications: None  Estimated Blood Loss: <21m (minimal)  Specimens: None  Implants: As above  Indications:  Visually significant age-related cataract, Right Eye  Procedure:  The patient was seen and identified in the pre-operative area. The operative eye was identified and dilated.  The operative eye was marked.  Topical anesthesia was administered to the operative eye.     The patient was then to the operative suite and placed in the supine position.  A timeout was performed confirming the patient, procedure to be performed, and all other relevant information.   The patient's face was prepped and draped in the usual fashion for intra-ocular surgery.  A lid speculum was placed into the operative eye and the surgical microscope moved into place and focused.  A superotemporal paracentesis was created using a 20 gauge paracentesis blade.  Shugarcaine was injected into the anterior chamber.  Viscoelastic was injected into the anterior chamber.  A temporal clear-corneal main wound incision was created using a 2.437mmicrokeratome.  A continuous curvilinear capsulorrhexis was initiated using an irrigating cystitome and completed using capsulorrhexis forceps.  Hydrodissection and hydrodeliniation were performed.  Viscoelastic was injected into the anterior chamber.  A phacoemulsification handpiece and a chopper as a second instrument were used to remove the nucleus and epinucleus. The irrigation/aspiration handpiece was used to remove any  remaining cortical material.   The capsular bag was reinflated with viscoelastic, checked, and found to be intact.  The intraocular lens was inserted into the capsular bag.  The irrigation/aspiration handpiece was used to remove any remaining viscoelastic.  The clear corneal wound and paracentesis wounds were then hydrated and checked with Weck-Cels to be watertight.  The lid-speculum and drape was removed, and the patient's face was cleaned with a wet and dry 4x4.  Maxitrol was instilled in the eye before a clear shield was taped over the eye. The patient was taken to the post-operative care unit in good condition, having tolerated the procedure well.  Post-Op Instructions: [facility] and will receive all other orders and instructions.

## 2020-08-25 NOTE — Anesthesia Preprocedure Evaluation (Signed)
Anesthesia Evaluation  Patient identified by MRN, date of birth, ID band Patient awake    Reviewed: Allergy & Precautions, NPO status , Patient's Chart, lab work & pertinent test results  History of Anesthesia Complications Negative for: history of anesthetic complications  Airway Mallampati: II  TM Distance: >3 FB Neck ROM: Full    Dental  (+) Dental Advisory Given, Missing   Pulmonary Current Smoker and Patient abstained from smoking.,    Pulmonary exam normal breath sounds clear to auscultation       Cardiovascular Exercise Tolerance: Good Normal cardiovascular exam Rhythm:Regular Rate:Normal     Neuro/Psych negative neurological ROS  negative psych ROS   GI/Hepatic negative GI ROS, Neg liver ROS,   Endo/Other  diabetes, Well Controlled, Type 2, Oral Hypoglycemic AgentsHypothyroidism   Renal/GU      Musculoskeletal  (+) Arthritis ,   Abdominal   Peds  Hematology   Anesthesia Other Findings   Reproductive/Obstetrics                             Anesthesia Physical Anesthesia Plan  ASA: II  Anesthesia Plan: MAC   Post-op Pain Management:    Induction: Intravenous  PONV Risk Score and Plan:   Airway Management Planned: Nasal Cannula and Natural Airway  Additional Equipment:   Intra-op Plan:   Post-operative Plan:   Informed Consent: I have reviewed the patients History and Physical, chart, labs and discussed the procedure including the risks, benefits and alternatives for the proposed anesthesia with the patient or authorized representative who has indicated his/her understanding and acceptance.     Dental advisory given  Plan Discussed with: CRNA and Surgeon  Anesthesia Plan Comments:         Anesthesia Quick Evaluation

## 2020-08-28 ENCOUNTER — Encounter (HOSPITAL_COMMUNITY): Payer: Self-pay | Admitting: Ophthalmology

## 2020-09-04 ENCOUNTER — Encounter (HOSPITAL_COMMUNITY): Payer: BC Managed Care – PPO

## 2020-09-06 ENCOUNTER — Other Ambulatory Visit (HOSPITAL_COMMUNITY): Payer: BC Managed Care – PPO

## 2020-10-03 NOTE — H&P (Signed)
Surgical History & Physical  Patient Name: Denise Sanders DOB: 08-30-1955  Surgery: Cataract extraction with intraocular lens implant phacoemulsification; Left Eye  Surgeon: Baruch Goldmann MD Surgery Date:  10/20/2020 Pre-Op Date:  10/03/2020  HPI: A 34 Yr. old female patient 1. The patient complains of difficulty when driving due to glare from headlights or sun, which began 3 months ago. The left eye is affected. The episode is gradual. The condition's severity increased since last visit. Symptoms occur when the patient is driving and outside. 2. The patient is returning after cataract post-op. The right eye is affected. Status post cataract post-op, which began 1 week ago: Since the last visit, the affected area is doing well. The patient's vision is improved. Patient is following medication instructions. HPI Completed by Dr. Baruch Goldmann  Medical History: Cataracts Degenerative Disorder of Macula Diabetes Hypothyroidism Thyroid Problems  Review of Systems Negative Allergic/Immunologic Negative Cardiovascular Negative Constitutional Negative Ear, Nose, Mouth & Throat Negative Endocrine Negative Eyes Negative Gastrointestinal Negative Genitourinary Negative Hemotologic/Lymphatic Negative Integumentary Negative Musculoskeletal Negative Neurological Negative Psychiatry Negative Respiratory  Social   Current every day smoker   Medication Ketorolac, Moxifloxacin, Prednisolone acetate 1%,  Atorvastatin, Diclofenac Sodium, Glipizide, Levothyroxine Sodium, Metformin, Ozempic,   Sx/Procedures Phaco c IOL OD,  Hysterectomy, Tracheotomy,   Drug Allergies   NKDA  History & Physical: Heent:  Cataract, Left eye NECK: supple without bruits LUNGS: lungs clear to auscultation CV: regular rate and rhythm Abdomen: soft and non-tender  Impression & Plan: Assessment: 1.  CATARACT EXTRACTION STATUS; Right Eye (Z98.41) 2.  INTRAOCULAR LENS IOL (Z96.1) 3.  NUCLEAR SCLEROSIS AGE  RELATED; , Left Eye (H25.12)  Plan: 1.  1 week after cataract surgery. Doing well with improved vision and normal eye pressure. Call with any problems or concerns. Stop Vigamox. Continue Ilevro 1 drop 1x/day for 3 more weeks. Continue Pred Acetate 1 drop 2x/day for 3 more weeks.  2.  Stable. Continue Post-op medications  3.  Cataract accounts for the patient's decreased vision. This visual impairment is not correctable with a tolerable change in glasses or contact lenses. Cataract surgery with an implantation of a new lens should significantly improve the visual and functional status of the patient. Discussed all risks, benefits, alternatives, and potential complications. Discussed the procedures and recovery. Patient desires to have surgery. A-scan ordered and performed today for intra-ocular lens calculations. The surgery will be performed in order to improve vision for driving, reading, and for eye examinations. Recommend phacoemulsification with intra-ocular lens. Recommend Dextenza for post-operative pain and inflammation. Left Eye. Surgery required to correct imbalance of vision. Dilates well - shugarcaine by protocol.

## 2020-10-16 ENCOUNTER — Encounter (HOSPITAL_COMMUNITY)
Admission: RE | Admit: 2020-10-16 | Discharge: 2020-10-16 | Disposition: A | Discharge status: Home / Self Care | Payer: BC Managed Care – PPO | Source: Ambulatory Visit | Attending: Ophthalmology | Admitting: Ophthalmology

## 2020-10-16 ENCOUNTER — Other Ambulatory Visit: Payer: Self-pay

## 2020-10-16 ENCOUNTER — Encounter (HOSPITAL_COMMUNITY): Payer: Self-pay

## 2020-10-20 ENCOUNTER — Other Ambulatory Visit: Payer: Self-pay

## 2020-10-20 ENCOUNTER — Encounter (HOSPITAL_COMMUNITY)
Admission: RE | Disposition: A | Discharge status: Home / Self Care | Payer: Self-pay | Source: Home / Self Care | Attending: Ophthalmology

## 2020-10-20 ENCOUNTER — Ambulatory Visit (HOSPITAL_COMMUNITY): Payer: BC Managed Care – PPO | Admitting: Anesthesiology

## 2020-10-20 ENCOUNTER — Ambulatory Visit (HOSPITAL_COMMUNITY)
Admission: RE | Admit: 2020-10-20 | Discharge: 2020-10-20 | Disposition: A | Discharge status: Home / Self Care | Payer: BC Managed Care – PPO | Attending: Ophthalmology | Admitting: Ophthalmology

## 2020-10-20 ENCOUNTER — Encounter (HOSPITAL_COMMUNITY): Payer: Self-pay | Admitting: Ophthalmology

## 2020-10-20 DIAGNOSIS — Z90710 Acquired absence of both cervix and uterus: Secondary | ICD-10-CM | POA: Diagnosis not present

## 2020-10-20 DIAGNOSIS — H2512 Age-related nuclear cataract, left eye: Secondary | ICD-10-CM | POA: Insufficient documentation

## 2020-10-20 DIAGNOSIS — E1136 Type 2 diabetes mellitus with diabetic cataract: Secondary | ICD-10-CM | POA: Insufficient documentation

## 2020-10-20 DIAGNOSIS — Z930 Tracheostomy status: Secondary | ICD-10-CM | POA: Diagnosis not present

## 2020-10-20 DIAGNOSIS — F17200 Nicotine dependence, unspecified, uncomplicated: Secondary | ICD-10-CM | POA: Insufficient documentation

## 2020-10-20 DIAGNOSIS — Z9841 Cataract extraction status, right eye: Secondary | ICD-10-CM | POA: Diagnosis not present

## 2020-10-20 DIAGNOSIS — Z961 Presence of intraocular lens: Secondary | ICD-10-CM | POA: Diagnosis not present

## 2020-10-20 HISTORY — PX: CATARACT EXTRACTION W/PHACO: SHX586

## 2020-10-20 LAB — GLUCOSE, CAPILLARY: Glucose-Capillary: 122 mg/dL — ABNORMAL HIGH (ref 70–99)

## 2020-10-20 SURGERY — PHACOEMULSIFICATION, CATARACT, WITH IOL INSERTION
Anesthesia: Monitor Anesthesia Care | Site: Eye | Laterality: Left

## 2020-10-20 MED ORDER — EPINEPHRINE PF 1 MG/ML IJ SOLN
INTRAOCULAR | Status: DC | PRN
  Administered 2020-10-20: 500 mL

## 2020-10-20 MED ORDER — MIDAZOLAM HCL 5 MG/5ML IJ SOLN
INTRAMUSCULAR | Status: DC | PRN
  Administered 2020-10-20: 2 mg via INTRAVENOUS

## 2020-10-20 MED ORDER — SODIUM HYALURONATE 10 MG/ML IO SOLUTION
PREFILLED_SYRINGE | INTRAOCULAR | Status: DC | PRN
  Administered 2020-10-20: 0.85 mL via INTRAOCULAR

## 2020-10-20 MED ORDER — PHENYLEPHRINE HCL 2.5 % OP SOLN
1.0000 [drp] | OPHTHALMIC | Status: AC | PRN
  Administered 2020-10-20 (×3): 1 [drp] via OPHTHALMIC

## 2020-10-20 MED ORDER — POVIDONE-IODINE 5 % OP SOLN
OPHTHALMIC | Status: DC | PRN
  Administered 2020-10-20: 1 via OPHTHALMIC

## 2020-10-20 MED ORDER — NEOMYCIN-POLYMYXIN-DEXAMETH 3.5-10000-0.1 OP SUSP
OPHTHALMIC | Status: DC | PRN
  Administered 2020-10-20: 1 [drp] via OPHTHALMIC

## 2020-10-20 MED ORDER — LIDOCAINE HCL 3.5 % OP GEL
1.0000 "application " | Freq: Once | OPHTHALMIC | Status: AC
  Administered 2020-10-20: 1 via OPHTHALMIC

## 2020-10-20 MED ORDER — STERILE WATER FOR IRRIGATION IR SOLN
Status: DC | PRN
  Administered 2020-10-20: 250 mL

## 2020-10-20 MED ORDER — TROPICAMIDE 1 % OP SOLN
1.0000 [drp] | OPHTHALMIC | Status: AC
  Administered 2020-10-20 (×3): 1 [drp] via OPHTHALMIC

## 2020-10-20 MED ORDER — SODIUM HYALURONATE 23MG/ML IO SOSY
PREFILLED_SYRINGE | INTRAOCULAR | Status: DC | PRN
  Administered 2020-10-20: 0.6 mL via INTRAOCULAR

## 2020-10-20 MED ORDER — EPINEPHRINE PF 1 MG/ML IJ SOLN
INTRAMUSCULAR | Status: AC
  Filled 2020-10-20: qty 2

## 2020-10-20 MED ORDER — LIDOCAINE HCL (PF) 1 % IJ SOLN
INTRAOCULAR | Status: DC | PRN
  Administered 2020-10-20: 1 mL via OPHTHALMIC

## 2020-10-20 MED ORDER — BSS IO SOLN
INTRAOCULAR | Status: DC | PRN
  Administered 2020-10-20: 15 mL via INTRAOCULAR

## 2020-10-20 MED ORDER — TETRACAINE HCL 0.5 % OP SOLN
1.0000 [drp] | OPHTHALMIC | Status: AC | PRN
  Administered 2020-10-20 (×3): 1 [drp] via OPHTHALMIC

## 2020-10-20 MED ORDER — MIDAZOLAM HCL 2 MG/2ML IJ SOLN
INTRAMUSCULAR | Status: AC
  Filled 2020-10-20: qty 2

## 2020-10-20 SURGICAL SUPPLY — 11 items
CLOTH BEACON ORANGE TIMEOUT ST (SAFETY) ×3 IMPLANT
EYE SHIELD UNIVERSAL CLEAR (GAUZE/BANDAGES/DRESSINGS) ×3 IMPLANT
GLOVE SURG UNDER POLY LF SZ6.5 (GLOVE) ×3 IMPLANT
GLOVE SURG UNDER POLY LF SZ7 (GLOVE) ×3 IMPLANT
NEEDLE HYPO 18GX1.5 BLUNT FILL (NEEDLE) ×3 IMPLANT
PAD ARMBOARD 7.5X6 YLW CONV (MISCELLANEOUS) ×3 IMPLANT
SYR TB 1ML LL NO SAFETY (SYRINGE) ×3 IMPLANT
TAPE SURG TRANSPORE 1 IN (GAUZE/BANDAGES/DRESSINGS) ×1 IMPLANT
TAPE SURGICAL TRANSPORE 1 IN (GAUZE/BANDAGES/DRESSINGS) ×2
TECNIS IOL (Intraocular Lens) ×3 IMPLANT
WATER STERILE IRR 250ML POUR (IV SOLUTION) ×3 IMPLANT

## 2020-10-20 NOTE — Discharge Instructions (Addendum)
Please discharge patient when stable, will follow up today with Dr. Wrzosek at the [facility] [place] office immediately following discharge.  Leave shield in place until visit.  All paperwork with discharge instructions will be given at the office.  [facility] Address:  [address]  [place], [place] [address]   PATIENT INSTRUCTIONS POST-ANESTHESIA  IMMEDIATELY FOLLOWING SURGERY:  Do not drive or operate machinery for the first twenty four hours after surgery.  Do not make any important decisions for twenty four hours after surgery or while taking narcotic pain medications or sedatives.  If you develop intractable nausea and vomiting or a severe headache please notify your doctor immediately.  FOLLOW-UP:  Please make an appointment with your surgeon as instructed. You do not need to follow up with anesthesia unless specifically instructed to do so.  WOUND CARE INSTRUCTIONS (if applicable):  Keep a dry clean dressing on the anesthesia/puncture wound site if there is drainage.  Once the wound has quit draining you may leave it open to air.  Generally you should leave the bandage intact for twenty four hours unless there is drainage.  If the epidural site drains for more than 36-48 hours please call the anesthesia department.  QUESTIONS?:  Please feel free to call your physician or the hospital operator if you have any questions, and they will be happy to assist you.       

## 2020-10-20 NOTE — Op Note (Signed)
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 Haizley Cannella, MD, MA  Anesthesia: MAC, Topical [place]  Complications: None  Estimated Blood Loss: <54m (minimal)  Specimens: None  Implants: As above  Indications:  Visually significant age-related cataract, Left Eye  Procedure:  The patient was seen and identified in the pre-operative area. The operative eye was identified and dilated.  The operative eye was marked.  Topical anesthesia was administered to the operative eye.     The patient was then to the operative suite and placed in the supine position.  A timeout was performed confirming the patient, procedure to be performed, and all other relevant information.   The patient's face was prepped and draped in the usual fashion for intra-ocular surgery.  A lid speculum was placed into the operative eye and the surgical microscope moved into place and focused.  An inferotemporal paracentesis was created using a 20 gauge paracentesis blade.  Shugarcaine was injected into the anterior chamber.  Viscoelastic was injected into the anterior chamber.  A temporal clear-corneal main wound incision was created using a 2.451mmicrokeratome.  A continuous curvilinear capsulorrhexis was initiated using an irrigating cystitome and completed using capsulorrhexis forceps.  Hydrodissection and hydrodeliniation were performed.  Viscoelastic was injected into the anterior chamber.  A phacoemulsification handpiece and a chopper as a second instrument were used to remove the nucleus and epinucleus. The irrigation/aspiration handpiece was used to remove any remaining  cortical material.   The capsular bag was reinflated with viscoelastic, checked, and found to be intact.  The intraocular lens was inserted into the capsular bag.  The irrigation/aspiration handpiece was used to remove any remaining viscoelastic.  The clear corneal wound and paracentesis wounds were then hydrated and checked with Weck-Cels to be watertight.  The lid-speculum and drape was removed, and the patient's face was cleaned with a wet and dry 4x4.  Maxitrol was instilled in the eye before a clear shield was taped over the eye. The patient was taken to the post-operative care unit in good condition, having tolerated the procedure well.  Post-Op Instructions: [facility] and will receive all other orders and instructions.

## 2020-10-20 NOTE — Anesthesia Preprocedure Evaluation (Signed)
Anesthesia Evaluation  Patient identified by MRN, date of birth, ID band Patient awake    Reviewed: Allergy & Precautions, NPO status , Patient's Chart, lab work & pertinent test results  History of Anesthesia Complications Negative for: history of anesthetic complications  Airway Mallampati: II  TM Distance: >3 FB Neck ROM: Full    Dental  (+) Dental Advisory Given, Missing   Pulmonary Current Smoker and Patient abstained from smoking.,    Pulmonary exam normal breath sounds clear to auscultation       Cardiovascular Exercise Tolerance: Good Normal cardiovascular exam Rhythm:Regular Rate:Normal     Neuro/Psych negative neurological ROS  negative psych ROS   GI/Hepatic negative GI ROS, Neg liver ROS,   Endo/Other  diabetes, Well Controlled, Type 2, Oral Hypoglycemic AgentsHypothyroidism   Renal/GU      Musculoskeletal  (+) Arthritis ,   Abdominal   Peds  Hematology   Anesthesia Other Findings   Reproductive/Obstetrics                             Anesthesia Physical  Anesthesia Plan  ASA: 2  Anesthesia Plan: MAC   Post-op Pain Management:    Induction: Intravenous  PONV Risk Score and Plan:   Airway Management Planned: Nasal Cannula and Natural Airway  Additional Equipment:   Intra-op Plan:   Post-operative Plan:   Informed Consent: I have reviewed the patients History and Physical, chart, labs and discussed the procedure including the risks, benefits and alternatives for the proposed anesthesia with the patient or authorized representative who has indicated his/her understanding and acceptance.     Dental advisory given  Plan Discussed with: CRNA and Surgeon  Anesthesia Plan Comments:         Anesthesia Quick Evaluation

## 2020-10-20 NOTE — Interval H&P Note (Signed)
History and Physical Interval Note:  10/20/2020 12:05 PM  Denise Sanders  has presented today for surgery, with the diagnosis of Nuclear sclerotic cataract - Left eye.  The various methods of treatment have been discussed with the patient and family. After consideration of risks, benefits and other options for treatment, the patient has consented to  Procedure(s) with comments: CATARACT EXTRACTION PHACO AND INTRAOCULAR LENS PLACEMENT (IOC) (Left) - CDE:  as a surgical intervention.  The patient's history has been reviewed, patient examined, no change in status, stable for surgery.  I have reviewed the patient's chart and labs.  Questions were answered to the patient's satisfaction.     Baruch Goldmann

## 2020-10-20 NOTE — Transfer of Care (Signed)
Immediate Anesthesia Transfer of Care Note  Patient: Denise Sanders  Procedure(s) Performed: CATARACT EXTRACTION PHACO AND INTRAOCULAR LENS PLACEMENT (IOC) (Left: Eye)  Patient Location: Short Stay  Anesthesia Type:MAC  Level of Consciousness: awake, alert , oriented and patient cooperative  Airway & Oxygen Therapy: Patient Spontanous Breathing  Post-op Assessment: Report given to RN, Post -op Vital signs reviewed and stable and Patient moving all extremities  Post vital signs: Reviewed and stable  Last Vitals:  Vitals Value Taken Time  BP    Temp    Pulse    Resp    SpO2      Last Pain:  Vitals:   10/20/20 1137  TempSrc: Oral  PainSc: 0-No pain      Patients Stated Pain Goal: 7 (32/76/14 [id number])  Complications: No notable events documented.

## 2020-10-20 NOTE — Anesthesia Postprocedure Evaluation (Signed)
Anesthesia Post Note  Patient: Denise Sanders  Procedure(s) Performed: CATARACT EXTRACTION PHACO AND INTRAOCULAR LENS PLACEMENT (IOC) (Left: Eye)  Patient location during evaluation: Phase II Anesthesia Type: MAC Level of consciousness: awake and alert and oriented Pain management: pain level controlled Vital Signs Assessment: post-procedure vital signs reviewed and stable Respiratory status: spontaneous breathing and respiratory function stable Cardiovascular status: blood pressure returned to baseline and stable Postop Assessment: no apparent nausea or vomiting Anesthetic complications: no   No notable events documented.   Last Vitals:  Vitals:   10/20/20 1200 10/20/20 1306  BP: 112/68 117/82  Pulse:  76  Resp:  13  Temp:  36.7 C  SpO2:  98%    Last Pain:  Vitals:   10/20/20 1306  TempSrc: Oral  PainSc:                  Moana Munford C Mahek Schlesinger

## 2020-10-24 ENCOUNTER — Encounter (HOSPITAL_COMMUNITY): Payer: Self-pay | Admitting: Ophthalmology

## 2020-10-25 ENCOUNTER — Other Ambulatory Visit: Payer: Self-pay | Admitting: Sports Medicine

## 2020-10-25 DIAGNOSIS — N6320 Unspecified lump in the left breast, unspecified quadrant: Secondary | ICD-10-CM

## 2020-11-06 ENCOUNTER — Ambulatory Visit
Admission: RE | Admit: 2020-11-06 | Discharge: 2020-11-06 | Disposition: A | Discharge status: Home / Self Care | Payer: BC Managed Care – PPO | Source: Ambulatory Visit | Attending: Sports Medicine | Admitting: Sports Medicine

## 2020-11-06 ENCOUNTER — Other Ambulatory Visit: Payer: Self-pay | Admitting: Sports Medicine

## 2020-11-06 ENCOUNTER — Other Ambulatory Visit: Payer: Self-pay

## 2020-11-06 DIAGNOSIS — N6320 Unspecified lump in the left breast, unspecified quadrant: Secondary | ICD-10-CM

## 2020-11-09 ENCOUNTER — Encounter: Payer: Self-pay | Admitting: General Surgery

## 2020-11-09 ENCOUNTER — Other Ambulatory Visit: Payer: Self-pay

## 2020-11-09 ENCOUNTER — Ambulatory Visit (INDEPENDENT_AMBULATORY_CARE_PROVIDER_SITE_OTHER): Payer: BC Managed Care – PPO | Admitting: General Surgery

## 2020-11-09 VITALS — BP 132/85 | HR 85 | Temp 98.5°F | Resp 18 | Ht 63.5 in | Wt 142.0 lb

## 2020-11-09 DIAGNOSIS — C50912 Malignant neoplasm of unspecified site of left female breast: Secondary | ICD-10-CM | POA: Diagnosis not present

## 2020-11-09 NOTE — Patient Instructions (Addendum)
Call office once you have talked to your work. Radiofrequency tag will need to be placed for surgery. Aiming for surgery week 8/22 if possible, or the next week.   Surgical Options for Early-Stage Breast Cancer  Surgery is usually the first treatment for early-stage breast cancer. Most women have two surgery options. One is called partial mastectomy, or breast-sparing or breast-conserving surgery, and the other is calledmastectomy. Both surgeries have good survival rates. Breast cancer is different for everyone, even in its early stage. The best treatment for one person might not be the best treatment for another. Learn as much as you can about your cancer and work closely with your health careproviders to make the choice that produces the best results for you. What is partial mastectomy? Partial mastectomy, also called breast-sparing surgery or breast-conserving surgery, is surgery to remove the cancer along with some normal breast tissue that surrounds it. Lymph nodes from under the arm may also be removed and tested to find out if the cancer has spread. If cancer is located near thechest wall, part of the chest wall lining may also be removed. What is a mastectomy? A mastectomy is surgery to remove the cancer along with the entire breast tissue. There are several types of mastectomy: Simple or total mastectomy. In this surgery the entire breast is removed, including breast tissue, nipple, areola and skin around the breast. Some lymph nodes may also be removed from under the arm. If cancer is located near the chest wall, part of the chest wall lining may also be removed. Skin-sparing mastectomy. In this surgery the breast tissue, nipple, and areola are removed and most of the skin over the breast is left in place. This surgery results in less scar tissue than other mastectomy surgeries, which allows for a more natural breast reconstruction. Nipple-sparing mastectomy. In this surgery, breast tissue is  removed but the skin and nipple is left in place. The tissue under the nipple and areola may be removed if cancer is found in the area. This may be an option for women who choose to have breast reconstruction after mastectomy. Modified radical mastectomy. This surgery is the same as a simple mastectomy but also includes removing lymph nodes from under the arm (axillary lymph node dissection). Radical mastectomy. In this surgery the entire breast, the lymph nodes under the arm, and the chest wall muscles under the breast are removed. This surgery is rarely done now. A modified radical mastectomy is preferred because it is just as effective, but with the added advantage of fewer side effects. What are some advantages and disadvantages of these surgeries? Partial mastectomy Advantages of partial mastectomy include: Keeping most of your breast tissue intact, allowing for a more natural look to the breast. Easier recovery when compared to a mastectomy. Ability to go home on the day of the procedure. Disadvantages of partial mastectomy include: Slightly higher risk that your cancer will come back. Needing more surgery at a later time. Requiring radiation therapy after surgery, which has side effects and possible complications. This is done to reduce the chances of breast cancer returning. Mastectomy Advantages of a mastectomy include: Not needing to have radiation therapy or other treatments after surgery. Lower chances of your cancer coming back. Disadvantages of a mastectomy include: Longer recovery time compared to partial mastectomy. Possibility of more complications. Requiring additional surgeries to reconstruct your breast. Questions to ask Here are some questions to ask about each surgery: What will my recovery be like? How will my  breast look and feel? What are the possible risks and complications of the surgery? What additional treatment might I need after surgery? What are the risks  and complications of radiation therapy? What are the risks and complications of chemotherapy? Will I be able to have breast reconstruction? Where to find more information [place]: https://www.cancer.gov American Cancer Society: http://www.cancer.org Summary Surgery is usually the first treatment for early-stage breast cancer. Most women have two surgery options. One option is called partial mastectomy, or breast-sparing or breast-conserving surgery, and the other is called mastectomy. Both surgeries have good survival rates. Each option has advantages and disadvantages to consider. The best treatment for one person might not be the best treatment for you. Learn as much as you can about your cancer and work closely with your health care providers to make the choice that produces the best results for you. This information is not intended to replace advice given to you by your health care provider. Make sure you discuss any questions you have with your healthcare provider. Document Revised: 09/22/2019 Document Reviewed: 09/22/2019 Elsevier Patient Education  2022 [place].  Sentinel Lymph Node Biopsy  A sentinel lymph node biopsy is a procedure to identify, remove, and check a lymph node for cancer. Lymph is excess fluid from the tissues in your body. It is removed through the lymphatic system. This system is also a part of your body's defense system (immune system). The system includes lymph nodes and lymph vessels. Certain types of cancer can spread to nearby lymph nodes through the lymphatic system. The cancer usually spreads to one lymph node first, and then to others. The first lymph node that the cancer could spread to is called the sentinellymph node. In some cases, there may be more than one sentinel lymph node. You may have this procedure to determine whether your cancer has spread and tohelp your health care provider plan your treatment. If no cancer is found in the  sentinel lymph node, it is very unlikely that the cancer has spread to any of your other lymph nodes. If cancer is found in thesentinel lymph node, additional lymph nodes may be removed for testing. Tell a health care provider about: Any allergies you have, including any history of problems with contrast dye. All medicines you are taking, including vitamins, herbs, eye drops, creams, and over-the-counter medicines. Any problems you or family members have had with anesthetic medicines. Any blood disorders you have. Any surgeries you have had. Any medical conditions you have. Whether you are pregnant or may be pregnant. What are the risks? Generally, this is a safe procedure. However, problems may occur, including: Infection. Bleeding. Allergic reaction to medicines or dyes. Staining of the skin where dye is injected. Damaged lymph vessels, causing a buildup of fluid (lymphedema). Pain or bruising at the biopsy site. Medicines Ask your health care provider about: Changing or stopping your regular medicines. This is especially important if you are taking diabetes medicines or blood thinners. Taking medicines such as aspirin and ibuprofen. These medicines can thin your blood. Do not take these medicines before your procedure unless your health care provider instructs you to do so. Taking over-the-counter medicines, vitamins, herbs, and supplements. Surgery safety Ask your health care provider: How your surgery site will be marked. What steps will be taken to help prevent infection. These steps may include: Removing hair at the surgery site. Washing skin with a germ-killing soap. Receiving antibiotic medicine. General instructions Do not use any products that contain  nicotine or tobacco for at least 2 weeks before the procedure. These products include cigarettes, chewing tobacco, and vaping devices, such as e-cigarettes. If you need help quitting, ask your health care provider. You may have  blood tests to make sure your blood clots normally. Plan to have a responsible adult take you home from the hospital or clinic. What happens during the procedure?  An IV will be inserted into one of your veins. You will be given one or more of the following: A medicine to help you relax (sedative). A medicine to numb the area (local anesthetic). A medicine to make you fall asleep (general anesthetic). Blue dye, a radioactive substance, or both will be injected around the tumor. Both the dye and the radioactive substance will follow the same path that a spreading cancer would likely follow. The blue dye will reach your lymph nodes quickly. It may be given just before surgery. The radioactive substance will take longer to reach your lymph nodes. It may be given 2-24 hours before surgery, depending on your hospital. If a radioactive substance was injected, a scanner will show where the substance has spread. This will help identify the sentinel lymph node. The surgeon will make a small incision. If blue dye was injected, your surgeon will look for any lymph nodes that have picked up the dye. Sentinel lymph nodes will be removed and sent to a lab for testing. If no cancer is found, no other lymph nodes will be removed. It is unlikely the cancer has spread. If cancer is found, the surgeon will remove other lymph nodes for testing. This may happen during the same procedure or at a later time. The incision will be closed with stitches (sutures) or skin glue. A small dressing may be taped over the incision area. The procedure may vary among health care providers and hospitals. What happens after the procedure? Your blood pressure, heart rate, breathing rate, and blood oxygen level will be monitored until you leave the hospital or clinic. Your urine or stool may be blue for the next 24-48 hours. This is normal. It is caused by the dye that is used during the procedure. If you were given a sedative during  the procedure, it can affect you for several hours. Do not drive or operate machinery until your health care provider says that it is safe. It is up to you to get the results of your procedure. Ask your health care provider, or the department that is doing the procedure, when your results will be ready. Summary A sentinel lymph node biopsy is a procedure to identify, remove, and examine one or more lymph nodes for cancer. If you have cancer, you may have this procedure to determine whether your cancer has spread. If no cancer is found in the sentinel lymph node, it is very unlikely that the cancer has spread to any other lymph nodes. If cancer is found in the sentinel lymph node, your surgeon may remove additional lymph nodes for testing. This information is not intended to replace advice given to you by your health care provider. Make sure you discuss any questions you have with your healthcare provider. Document Revised: 01/20/2020 Document Reviewed: 01/20/2020 Elsevier Patient Education  [place].
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
## 2020-11-14 ENCOUNTER — Other Ambulatory Visit (HOSPITAL_COMMUNITY): Payer: Self-pay | Admitting: General Surgery

## 2020-11-14 DIAGNOSIS — C50912 Malignant neoplasm of unspecified site of left female breast: Secondary | ICD-10-CM

## 2020-11-14 DIAGNOSIS — R928 Other abnormal and inconclusive findings on diagnostic imaging of breast: Secondary | ICD-10-CM

## 2020-11-16 ENCOUNTER — Encounter: Payer: Self-pay | Admitting: Family Medicine
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
## 2020-12-11 ENCOUNTER — Encounter (HOSPITAL_COMMUNITY): Payer: Self-pay

## 2020-12-11 ENCOUNTER — Other Ambulatory Visit (HOSPITAL_COMMUNITY): Payer: Self-pay | Admitting: General Surgery

## 2020-12-11 DIAGNOSIS — R928 Other abnormal and inconclusive findings on diagnostic imaging of breast: Secondary | ICD-10-CM

## 2020-12-11 NOTE — Patient Instructions (Signed)
TANEEKA HARDEMAN  12/11/2020     '@[email or web address]'$ @   Your procedure is scheduled on  12/13/2020.   Report to [facility] at  0800 A.M.   Call this number if you have problems the morning of surgery:  [phone]   Remember:  Do not eat or drink after midnight.     DO NOT take any medication for diabetes the morning of your procedure.      Take these medicines the morning of surgery with A SIP OF WATER                   wellbutrin, gabapentin, levothyroxine.     Do not wear jewelry, make-up or nail polish.  Do not wear lotions, powders, or perfumes, or deodorant.  Do not shave 48 hours prior to surgery.  Men may shave face and neck.  Do not bring valuables to the hospital.  [facility] is not responsible for any belongings or valuables.  Contacts, dentures or bridgework may not be worn into surgery.  Leave your suitcase in the car.  After surgery it may be brought to your room.  For patients admitted to the hospital, discharge time will be determined by your treatment team.  Patients discharged the day of surgery will not be allowed to drive home and must have someone with them for 24 hours.    Special instructions:   DO NOT smoke tobacco or vape for 24 hours before your procedure.  Please read over the following fact sheets that you were given. Coughing and Deep Breathing, Surgical Site Infection Prevention, Anesthesia Post-op Instructions, and Care and Recovery After Surgery      Sentinel Lymph Node Biopsy, Care After The following information offers guidance on how to care for yourself after your procedure. Your health care provider may also give you more specific instructions. If you have problems or questions, contact your health careprovider. What can I expect after the procedure? After the procedure, it is common to have: Blue urine or stool for the next 24-48 hours. This is normal. It is caused by the dye used during the procedure. Blue skin at the  injection site. This may last for up to 8 weeks. Numbness, tingling, or pain near your incision site. Swelling or bruising near your incision. Follow these instructions at home: Incision care     Follow instructions from your health care provider about how to take care of your incision. Make sure you: Wash your hands with soap and water for at least 20 seconds before and after you change your bandage (dressing). If soap and water are not available, use hand sanitizer. Change your dressing as told by your health care provider. Leave stitches (sutures), skin glue, or adhesive strips in place. These skin closures may need to stay in place for 2 weeks or longer. If adhesive strip edges start to loosen and curl up, you may trim the loose edges. Do not remove adhesive strips completely unless your health care provider tells you to do that. Check your incision area every day for signs of infection. Check for: Redness, swelling, or more pain. Fluid or blood. Warmth. Pus or a bad smell. Do not take baths, swim, or use a hot tub until your health care provider approves. Ask your health care provider if you can take showers. You may be able to shower 24 hours after your procedure. After a shower, pat the incision area dry with a clean towel.  Do not rub the incision. That could cause bleeding. Activity Avoid activities that take a lot of effort. If you were given a sedative during the procedure, it can affect you for several hours. Do not drive or operate machinery until your health care provider says that it is safe. Return to your normal activities as told by your health care provider. Ask your health care provider what activities are safe for you. General instructions Take over-the-counter and prescription medicines only as told by your health care provider. You may resume your regular diet. If the procedure was done on or near the lymph nodes under your arm (axillary lymph nodes), do not have your  blood pressure taken or have blood drawn from the arm on the side of the biopsy until your health care provider says it is okay. You may need to be screened for extra fluid around the lymph nodes (lymphedema). Follow instructions from your health care provider about how often you should be checked. Keep all follow-up visits. This is important. Contact a health care provider if: Your pain medicine is not helping. You have nausea and vomiting. You have redness, swelling, or more pain around your biopsy site. You have fluid, blood, pus, or a bad smell coming from your incision. Your incision feels warm to the touch, you have a fever, or chills. You have any new bruising. Get help right away if: You have pain that is getting worse, and your medicine is not helping. You have vomiting that will not stop. You have chest pain or trouble breathing. These symptoms may represent a serious problem that is an emergency. Do not wait to see if the symptoms will go away. Get medical help right away. Call your local emergency services (911 in the [place] Do not drive yourself to the hospital. Summary After the procedure, it is common to have blue urine or stool for the next 24-48 hours. Take over-the-counter and prescription medicines only as told by your health care provider. Follow instructions from your health care provider about how to take care of your incision. Check your incision area every day for signs of infection. Get help right away if you have chest pain or trouble breathing. This information is not intended to replace advice given to you by your health care provider. Make sure you discuss any questions you have with your healthcare provider. Document Revised: 01/20/2020 Document Reviewed: 01/20/2020 Elsevier Patient Education  [place] Anesthesia, Adult, Care After This sheet gives you information about how to care for yourself after your procedure. Your health care provider  may also give you more specific instructions. If you have problems or questions, contact your health careprovider. What can I expect after the procedure? After the procedure, the following side effects are common: Pain or discomfort at the IV site. Nausea. Vomiting. Sore throat. Trouble concentrating. Feeling cold or chills. Feeling weak or tired. Sleepiness and fatigue. Soreness and body aches. These side effects can affect parts of the body that were not involved in surgery. Follow these instructions at home: For the time period you were told by your health care provider:  Rest. Do not participate in activities where you could fall or become injured. Do not drive or use machinery. Do not drink alcohol. Do not take sleeping pills or medicines that cause drowsiness. Do not make important decisions or sign legal documents. Do not take care of children on your own.  Eating and drinking Follow any instructions from your health care provider  about eating or drinking restrictions. When you feel hungry, start by eating small amounts of foods that are soft and easy to digest (bland), such as toast. Gradually return to your regular diet. Drink enough fluid to keep your urine pale yellow. If you vomit, rehydrate by drinking water, juice, or clear broth. General instructions If you have sleep apnea, surgery and certain medicines can increase your risk for breathing problems. Follow instructions from your health care provider about wearing your sleep device: Anytime you are sleeping, including during daytime naps. While taking prescription pain medicines, sleeping medicines, or medicines that make you drowsy. Have a responsible adult stay with you for the time you are told. It is important to have someone help care for you until you are awake and alert. Return to your normal activities as told by your health care provider. Ask your health care provider what activities are safe for you. Take  over-the-counter and prescription medicines only as told by your health care provider. If you smoke, do not smoke without supervision. Keep all follow-up visits as told by your health care provider. This is important. Contact a health care provider if: You have nausea or vomiting that does not get better with medicine. You cannot eat or drink without vomiting. You have pain that does not get better with medicine. You are unable to pass urine. You develop a skin rash. You have a fever. You have redness around your IV site that gets worse. Get help right away if: You have difficulty breathing. You have chest pain. You have blood in your urine or stool, or you vomit blood. Summary After the procedure, it is common to have a sore throat or nausea. It is also common to feel tired. Have a responsible adult stay with you for the time you are told. It is important to have someone help care for you until you are awake and alert. When you feel hungry, start by eating small amounts of foods that are soft and easy to digest (bland), such as toast. Gradually return to your regular diet. Drink enough fluid to keep your urine pale yellow. Return to your normal activities as told by your health care provider. Ask your health care provider what activities are safe for you. This information is not intended to replace advice given to you by your health care provider. Make sure you discuss any questions you have with your healthcare provider. Document Revised: 12/23/2019 Document Reviewed: 07/22/2019 Elsevier Patient Education  2022 [place]. How to Use Chlorhexidine for Bathing Chlorhexidine gluconate (CHG) is a germ-killing (antiseptic) solution that is used to clean the skin. It can get rid of the bacteria that normally live on the skin and can keep them away for about 24 hours. To clean your skin with CHG, you may be given: A CHG solution to use in the shower or as part of a sponge bath. A  prepackaged cloth that contains CHG. Cleaning your skin with CHG may help lower the risk for infection: While you are staying in the intensive care unit of the hospital. If you have a vascular access, such as a central line, to provide short-term or long-term access to your veins. If you have a catheter to drain urine from your bladder. If you are on a ventilator. A ventilator is a machine that helps you breathe by moving air in and out of your lungs. After surgery. What are the risks? Risks of using CHG include: A skin reaction. Hearing loss, if CHG  gets in your ears. Eye injury, if CHG gets in your eyes and is not rinsed out. The CHG product catching fire. Make sure that you avoid smoking and flames after applying CHG to your skin. Do not use CHG: If you have a chlorhexidine allergy or have previously reacted to chlorhexidine. On babies younger than 55 months of age. How to use CHG solution Use CHG only as told by your health care provider, and follow the instructions on the label. Use the full amount of CHG as directed. Usually, this is one bottle. During a shower Follow these steps when using CHG solution during a shower (unless your health care provider gives you different instructions): Start the shower. Use your normal soap and shampoo to wash your face and hair. Turn off the shower or move out of the shower stream. Pour the CHG onto a clean washcloth. Do not use any type of brush or rough-edged sponge. Starting at your neck, lather your body down to your toes. Make sure you follow these instructions: If you will be having surgery, pay special attention to the part of your body where you will be having surgery. Scrub this area for at least 1 minute. Do not use CHG on your head or face. If the solution gets into your ears or eyes, rinse them well with water. Avoid your genital area. Avoid any areas of skin that have broken skin, cuts, or scrapes. Scrub your back and under your  arms. Make sure to wash skin folds. Let the lather sit on your skin for 1-2 minutes or as long as told by your health care provider. Thoroughly rinse your entire body in the shower. Make sure that all body creases and crevices are rinsed well. Dry off with a clean towel. Do not put any substances on your body afterward--such as powder, lotion, or perfume--unless you are told to do so by your health care provider. Only use lotions that are recommended by the manufacturer. Put on clean clothes or pajamas. If it is the night before your surgery, sleep in clean sheets.  During a sponge bath Follow these steps when using CHG solution during a sponge bath (unless your health care provider gives you different instructions): Use your normal soap and shampoo to wash your face and hair. Pour the CHG onto a clean washcloth. Starting at your neck, lather your body down to your toes. Make sure you follow these instructions: If you will be having surgery, pay special attention to the part of your body where you will be having surgery. Scrub this area for at least 1 minute. Do not use CHG on your head or face. If the solution gets into your ears or eyes, rinse them well with water. Avoid your genital area. Avoid any areas of skin that have broken skin, cuts, or scrapes. Scrub your back and under your arms. Make sure to wash skin folds. Let the lather sit on your skin for 1-2 minutes or as long as told by your health care provider. Using a different clean, wet washcloth, thoroughly rinse your entire body. Make sure that all body creases and crevices are rinsed well. Dry off with a clean towel. Do not put any substances on your body afterward--such as powder, lotion, or perfume--unless you are told to do so by your health care provider. Only use lotions that are recommended by the manufacturer. Put on clean clothes or pajamas. If it is the night before your surgery, sleep in clean sheets. How to  use CHG  prepackaged cloths Only use CHG cloths as told by your health care provider, and follow the instructions on the label. Use the CHG cloth on clean, dry skin. Do not use the CHG cloth on your head or face unless your health care provider tells you to. When washing with the CHG cloth: Avoid your genital area. Avoid any areas of skin that have broken skin, cuts, or scrapes. Before surgery Follow these steps when using a CHG cloth to clean before surgery (unless your health care provider gives you different instructions): Using the CHG cloth, vigorously scrub the part of your body where you will be having surgery. Scrub using a back-and-forth motion for 3 minutes. The area on your body should be completely wet with CHG when you are done scrubbing. Do not rinse. Discard the cloth and let the area air-dry. Do not put any substances on the area afterward, such as powder, lotion, or perfume. Put on clean clothes or pajamas. If it is the night before your surgery, sleep in clean sheets.  For general bathing Follow these steps when using CHG cloths for general bathing (unless your health care provider gives you different instructions). Use a separate CHG cloth for each area of your body. Make sure you wash between any folds of skin and between your fingers and toes. Wash your body in the following order, switching to a new cloth after each step: The front of your neck, shoulders, and chest. Both of your arms, under your arms, and your hands. Your stomach and groin area, avoiding the genitals. Your right leg and foot. Your left leg and foot. The back of your neck, your back, and your buttocks. Do not rinse. Discard the cloth and let the area air-dry. Do not put any substances on your body afterward--such as powder, lotion, or perfume--unless you are told to do so by your health care provider. Only use lotions that are recommended by the manufacturer. Put on clean clothes or pajamas. Contact a health  care provider if: Your skin gets irritated after scrubbing. You have questions about using your solution or cloth. Get help right away if: Your eyes become very red or swollen. Your eyes itch badly. Your skin itches badly and is red or swollen. Your hearing changes. You have trouble seeing. You have swelling or tingling in your mouth or throat. You have trouble breathing. You swallow any chlorhexidine. Summary Chlorhexidine gluconate (CHG) is a germ-killing (antiseptic) solution that is used to clean the skin. Cleaning your skin with CHG may help to lower your risk for infection. You may be given CHG to use for bathing. It may be in a bottle or in a prepackaged cloth to use on your skin. Carefully follow your health care provider's instructions and the instructions on the product label. Do not use CHG if you have a chlorhexidine allergy. Contact your health care provider if your skin gets irritated after scrubbing. This information is not intended to replace advice given to you by your health care provider. Make sure you discuss any questions you have with your healthcare provider. Document Revised: 08/20/2019 Document Reviewed: 09/24/2019 Elsevier Patient Education  [place].

## 2020-12-12 ENCOUNTER — Other Ambulatory Visit: Payer: Self-pay

## 2020-12-12 ENCOUNTER — Encounter (HOSPITAL_COMMUNITY)
Admission: RE | Admit: 2020-12-12 | Discharge: 2020-12-12 | Disposition: A | Discharge status: Home / Self Care | Payer: BC Managed Care – PPO | Source: Ambulatory Visit | Attending: General Surgery | Admitting: General Surgery

## 2020-12-12 ENCOUNTER — Ambulatory Visit (HOSPITAL_COMMUNITY)
Admission: RE | Admit: 2020-12-12 | Discharge: 2020-12-12 | Disposition: A | Discharge status: Home / Self Care | Payer: BC Managed Care – PPO | Source: Ambulatory Visit | Attending: General Surgery | Admitting: General Surgery

## 2020-12-12 ENCOUNTER — Encounter (HOSPITAL_COMMUNITY): Payer: Self-pay

## 2020-12-12 DIAGNOSIS — Z90710 Acquired absence of both cervix and uterus: Secondary | ICD-10-CM | POA: Diagnosis not present

## 2020-12-12 DIAGNOSIS — F17210 Nicotine dependence, cigarettes, uncomplicated: Secondary | ICD-10-CM | POA: Diagnosis not present

## 2020-12-12 DIAGNOSIS — Z7984 Long term (current) use of oral hypoglycemic drugs: Secondary | ICD-10-CM | POA: Diagnosis not present

## 2020-12-12 DIAGNOSIS — C50912 Malignant neoplasm of unspecified site of left female breast: Secondary | ICD-10-CM | POA: Diagnosis not present

## 2020-12-12 DIAGNOSIS — Z01818 Encounter for other preprocedural examination: Secondary | ICD-10-CM | POA: Insufficient documentation

## 2020-12-12 DIAGNOSIS — Z9079 Acquired absence of other genital organ(s): Secondary | ICD-10-CM | POA: Diagnosis not present

## 2020-12-12 DIAGNOSIS — R928 Other abnormal and inconclusive findings on diagnostic imaging of breast: Secondary | ICD-10-CM | POA: Insufficient documentation

## 2020-12-12 DIAGNOSIS — E039 Hypothyroidism, unspecified: Secondary | ICD-10-CM | POA: Diagnosis not present

## 2020-12-12 DIAGNOSIS — E119 Type 2 diabetes mellitus without complications: Secondary | ICD-10-CM | POA: Diagnosis not present

## 2020-12-12 HISTORY — DX: Essential (primary) hypertension: I10

## 2020-12-12 HISTORY — DX: Gastro-esophageal reflux disease without esophagitis: K21.9

## 2020-12-12 LAB — BASIC METABOLIC PANEL
Anion gap: 9 (ref 5–15)
BUN: 13 mg/dL (ref 8–23)
CO2: 26 mmol/L (ref 22–32)
Calcium: 9.3 mg/dL (ref 8.9–10.3)
Chloride: 104 mmol/L (ref 98–111)
Creatinine, Ser: 0.74 mg/dL (ref 0.44–1.00)
GFR, Estimated: >60 mL/min (ref >60)
Glucose, Bld: 57 mg/dL — ABNORMAL LOW (ref 70–99)
Potassium: 4.1 mmol/L (ref 3.5–5.1)
Sodium: 139 mmol/L (ref 135–145)

## 2020-12-12 LAB — HEMOGLOBIN A1C
Hgb A1c MFr Bld: 6.8 % — ABNORMAL HIGH (ref 4.8–5.6)
Mean Plasma Glucose: 148.46 mg/dL

## 2020-12-12 MED ORDER — LIDOCAINE HCL (PF) 2 % IJ SOLN
INTRAMUSCULAR | Status: AC
  Filled 2020-12-12: qty 20

## 2020-12-13 ENCOUNTER — Ambulatory Visit (HOSPITAL_COMMUNITY): Payer: BC Managed Care – PPO

## 2020-12-13 ENCOUNTER — Ambulatory Visit (HOSPITAL_COMMUNITY): Payer: BC Managed Care – PPO | Admitting: Anesthesiology

## 2020-12-13 ENCOUNTER — Ambulatory Visit (HOSPITAL_COMMUNITY)
Admission: RE | Admit: 2020-12-13 | Discharge: 2020-12-13 | Disposition: A | Discharge status: Home / Self Care | Payer: BC Managed Care – PPO | Attending: General Surgery | Admitting: General Surgery

## 2020-12-13 ENCOUNTER — Encounter (HOSPITAL_COMMUNITY)
Admission: RE | Disposition: A | Discharge status: Home / Self Care | Payer: Self-pay | Source: Home / Self Care | Attending: General Surgery

## 2020-12-13 ENCOUNTER — Ambulatory Visit (HOSPITAL_COMMUNITY)
Admission: RE | Admit: 2020-12-13 | Discharge: 2020-12-13 | Disposition: A | Discharge status: Home / Self Care | Payer: BC Managed Care – PPO | Source: Ambulatory Visit | Attending: General Surgery | Admitting: General Surgery

## 2020-12-13 ENCOUNTER — Encounter (HOSPITAL_COMMUNITY): Payer: Self-pay | Admitting: General Surgery

## 2020-12-13 DIAGNOSIS — C50912 Malignant neoplasm of unspecified site of left female breast: Secondary | ICD-10-CM

## 2020-12-13 DIAGNOSIS — C50112 Malignant neoplasm of central portion of left female breast: Secondary | ICD-10-CM

## 2020-12-13 DIAGNOSIS — D0512 Intraductal carcinoma in situ of left breast: Secondary | ICD-10-CM | POA: Diagnosis not present

## 2020-12-13 DIAGNOSIS — E119 Type 2 diabetes mellitus without complications: Secondary | ICD-10-CM | POA: Insufficient documentation

## 2020-12-13 DIAGNOSIS — F17210 Nicotine dependence, cigarettes, uncomplicated: Secondary | ICD-10-CM | POA: Insufficient documentation

## 2020-12-13 DIAGNOSIS — Z90710 Acquired absence of both cervix and uterus: Secondary | ICD-10-CM | POA: Insufficient documentation

## 2020-12-13 DIAGNOSIS — Z7984 Long term (current) use of oral hypoglycemic drugs: Secondary | ICD-10-CM | POA: Insufficient documentation

## 2020-12-13 DIAGNOSIS — E039 Hypothyroidism, unspecified: Secondary | ICD-10-CM | POA: Insufficient documentation

## 2020-12-13 DIAGNOSIS — Z9079 Acquired absence of other genital organ(s): Secondary | ICD-10-CM | POA: Insufficient documentation

## 2020-12-13 HISTORY — PX: PARTIAL MASTECTOMY WITH AXILLARY SENTINEL LYMPH NODE BIOPSY: SHX6004

## 2020-12-13 HISTORY — PX: BREAST LUMPECTOMY WITH RADIOFREQUENCY TAG IDENTIFICATION: SHX6884

## 2020-12-13 LAB — GLUCOSE, CAPILLARY
Glucose-Capillary: 140 mg/dL — ABNORMAL HIGH (ref 70–99)
Glucose-Capillary: 134 mg/dL — ABNORMAL HIGH (ref 70–99)

## 2020-12-13 SURGERY — PARTIAL MASTECTOMY WITH AXILLARY SENTINEL LYMPH NODE BIOPSY
Anesthesia: General | Site: Breast | Laterality: Left

## 2020-12-13 MED ORDER — HYDROMORPHONE HCL 1 MG/ML IJ SOLN
0.2500 mg | INTRAMUSCULAR | Status: DC | PRN
  Administered 2020-12-13: 0.5 mg via INTRAVENOUS
  Filled 2020-12-13: qty 0.5

## 2020-12-13 MED ORDER — KETOROLAC TROMETHAMINE 30 MG/ML IJ SOLN
INTRAMUSCULAR | Status: AC
  Filled 2020-12-13: qty 1

## 2020-12-13 MED ORDER — SODIUM CHLORIDE (PF) 0.9 % IJ SOLN
INTRAVENOUS | Status: DC | PRN
  Administered 2020-12-13: 3 mL via INTRADERMAL

## 2020-12-13 MED ORDER — PROPOFOL 10 MG/ML IV BOLUS
INTRAVENOUS | Status: DC | PRN
  Administered 2020-12-13: 50 mg via INTRAVENOUS
  Administered 2020-12-13: 200 mg via INTRAVENOUS
  Administered 2020-12-13 (×2): 50 mg via INTRAVENOUS

## 2020-12-13 MED ORDER — ONDANSETRON HCL 4 MG/2ML IJ SOLN: 4.0000 mg | Freq: Once | INTRAMUSCULAR | Status: DC | PRN

## 2020-12-13 MED ORDER — DEXAMETHASONE SODIUM PHOSPHATE 10 MG/ML IJ SOLN
INTRAMUSCULAR | Status: AC
  Filled 2020-12-13: qty 1

## 2020-12-13 MED ORDER — LIDOCAINE HCL (PF) 2 % IJ SOLN
INTRAMUSCULAR | Status: AC
  Filled 2020-12-13: qty 5

## 2020-12-13 MED ORDER — LACTATED RINGERS IV SOLN: INTRAVENOUS | Status: DC

## 2020-12-13 MED ORDER — TECHNETIUM TC 99M TILMANOCEPT KIT
1.0000 | PACK | Freq: Once | INTRAVENOUS | Status: AC | PRN
  Administered 2020-12-13: 1 via INTRADERMAL

## 2020-12-13 MED ORDER — PROPOFOL 10 MG/ML IV BOLUS
INTRAVENOUS | Status: AC
  Filled 2020-12-13: qty 20

## 2020-12-13 MED ORDER — GLYCOPYRROLATE PF 0.2 MG/ML IJ SOSY
PREFILLED_SYRINGE | INTRAMUSCULAR | Status: DC | PRN
  Administered 2020-12-13: .1 mg via INTRAVENOUS

## 2020-12-13 MED ORDER — LIDOCAINE 2% (20 MG/ML) 5 ML SYRINGE
INTRAMUSCULAR | Status: DC | PRN
  Administered 2020-12-13: 100 mg via INTRAVENOUS

## 2020-12-13 MED ORDER — ORAL CARE MOUTH RINSE: 15.0000 mL | Freq: Once | OROMUCOSAL | Status: DC

## 2020-12-13 MED ORDER — BUPIVACAINE HCL (PF) 0.5 % IJ SOLN
INTRAMUSCULAR | Status: AC
  Filled 2020-12-13: qty 30

## 2020-12-13 MED ORDER — GLYCOPYRROLATE PF 0.2 MG/ML IJ SOSY
PREFILLED_SYRINGE | INTRAMUSCULAR | Status: AC
  Filled 2020-12-13: qty 1

## 2020-12-13 MED ORDER — MIDAZOLAM HCL 2 MG/2ML IJ SOLN
INTRAMUSCULAR | Status: AC
  Filled 2020-12-13: qty 2

## 2020-12-13 MED ORDER — SODIUM CHLORIDE 0.9 % IV SOLN
2.0000 g | INTRAVENOUS | Status: AC
  Administered 2020-12-13: 2 g via INTRAVENOUS
  Filled 2020-12-13: qty 2

## 2020-12-13 MED ORDER — MEPERIDINE HCL 50 MG/ML IJ SOLN: 6.2500 mg | INTRAMUSCULAR | Status: DC | PRN

## 2020-12-13 MED ORDER — CHLORHEXIDINE GLUCONATE 0.12 % MT SOLN
15.0000 mL | Freq: Once | OROMUCOSAL | Status: DC
  Filled 2020-12-13: qty 15

## 2020-12-13 MED ORDER — PENTAFLUOROPROP-TETRAFLUOROETH EX AERO
INHALATION_SPRAY | CUTANEOUS | Status: AC
  Filled 2020-12-13: qty 116

## 2020-12-13 MED ORDER — CHLORHEXIDINE GLUCONATE CLOTH 2 % EX PADS: 6.0000 | MEDICATED_PAD | Freq: Once | CUTANEOUS | Status: DC

## 2020-12-13 MED ORDER — METHYLENE BLUE 0.5 % INJ SOLN
INTRAVENOUS | Status: AC
  Filled 2020-12-13: qty 10

## 2020-12-13 MED ORDER — 0.9 % SODIUM CHLORIDE (POUR BTL) OPTIME
TOPICAL | Status: DC | PRN
  Administered 2020-12-13: 1000 mL

## 2020-12-13 MED ORDER — KETOROLAC TROMETHAMINE 30 MG/ML IJ SOLN
30.0000 mg | Freq: Once | INTRAMUSCULAR | Status: AC
  Administered 2020-12-13: 30 mg via INTRAVENOUS

## 2020-12-13 MED ORDER — OXYCODONE HCL 5 MG PO TABS: 5.0000 mg | ORAL_TABLET | ORAL | 0 refills | Status: DC | PRN

## 2020-12-13 MED ORDER — ONDANSETRON HCL 4 MG/2ML IJ SOLN
INTRAMUSCULAR | Status: AC
  Filled 2020-12-13: qty 2

## 2020-12-13 MED ORDER — MIDAZOLAM HCL 5 MG/5ML IJ SOLN
INTRAMUSCULAR | Status: DC | PRN
  Administered 2020-12-13: 2 mg via INTRAVENOUS

## 2020-12-13 MED ORDER — BUPIVACAINE HCL (PF) 0.5 % IJ SOLN
INTRAMUSCULAR | Status: DC | PRN
  Administered 2020-12-13: 20 mL

## 2020-12-13 MED ORDER — DEXAMETHASONE SODIUM PHOSPHATE 4 MG/ML IJ SOLN
INTRAMUSCULAR | Status: DC | PRN
  Administered 2020-12-13: 4 mg via INTRAVENOUS

## 2020-12-13 MED ORDER — FENTANYL CITRATE (PF) 250 MCG/5ML IJ SOLN
INTRAMUSCULAR | Status: AC
  Filled 2020-12-13: qty 5

## 2020-12-13 MED ORDER — FENTANYL CITRATE (PF) 100 MCG/2ML IJ SOLN
INTRAMUSCULAR | Status: DC | PRN
  Administered 2020-12-13: 25 ug via INTRAVENOUS
  Administered 2020-12-13 (×4): 50 ug via INTRAVENOUS
  Administered 2020-12-13: 25 ug via INTRAVENOUS

## 2020-12-13 MED ORDER — ONDANSETRON HCL 4 MG/2ML IJ SOLN
INTRAMUSCULAR | Status: DC | PRN
  Administered 2020-12-13: 4 mg via INTRAVENOUS

## 2020-12-13 MED ORDER — ONDANSETRON HCL 4 MG PO TABS: 4.0000 mg | ORAL_TABLET | Freq: Three times a day (TID) | ORAL | 1 refills | Status: DC | PRN

## 2020-12-13 SURGICAL SUPPLY — 39 items
ADH SKN CLS APL DERMABOND .7 (GAUZE/BANDAGES/DRESSINGS) ×1
APL PRP STRL LF DISP 70% ISPRP (MISCELLANEOUS) ×1
APPLIER CLIP 11 MED OPEN (CLIP) ×2
APR CLP MED 11 20 MLT OPN (CLIP) ×1
BLADE SURG 15 STRL LF DISP TIS (BLADE) ×1 IMPLANT
BLADE SURG 15 STRL SS (BLADE) ×2
CHLORAPREP W/TINT 26 (MISCELLANEOUS) ×2 IMPLANT
CLIP APPLIE 11 MED OPEN (CLIP) ×1 IMPLANT
CLOTH BEACON ORANGE TIMEOUT ST (SAFETY) ×2 IMPLANT
COVER LIGHT HANDLE STERIS (MISCELLANEOUS) ×4 IMPLANT
COVER PROBE W GEL 5X96 (DRAPES) ×2 IMPLANT
COVER SURGICAL LIGHT HANDLE (MISCELLANEOUS) ×2 IMPLANT
DERMABOND ADVANCED (GAUZE/BANDAGES/DRESSINGS) ×1
DERMABOND ADVANCED .7 DNX12 (GAUZE/BANDAGES/DRESSINGS) ×1 IMPLANT
ELECT REM PT RETURN 9FT ADLT (ELECTROSURGICAL) ×2
ELECTRODE REM PT RTRN 9FT ADLT (ELECTROSURGICAL) ×1 IMPLANT
GAUZE 4X4 16PLY ~~LOC~~+RFID DBL (SPONGE) ×4 IMPLANT
GLOVE SURG ENC MOIS LTX SZ6.5 (GLOVE) ×2 IMPLANT
GLOVE SURG UNDER POLY LF SZ6.5 (GLOVE) ×2 IMPLANT
GLOVE SURG UNDER POLY LF SZ7 (GLOVE) ×4 IMPLANT
GOWN STRL REUS W/TWL LRG LVL3 (GOWN DISPOSABLE) ×6 IMPLANT
INST SET MINOR GENERAL (KITS) ×2 IMPLANT
KIT MARKER MARGIN INK (KITS) ×2 IMPLANT
KIT TURNOVER KIT A (KITS) ×2 IMPLANT
MANIFOLD NEPTUNE II (INSTRUMENTS) ×2 IMPLANT
MARKER SKIN DUAL TIP RULER LAB (MISCELLANEOUS) ×2 IMPLANT
NEEDLE HYPO 18GX1.5 BLUNT FILL (NEEDLE) ×2 IMPLANT
NEEDLE HYPO 25X1 1.5 SAFETY (NEEDLE) ×4 IMPLANT
NS IRRIG 1000ML POUR BTL (IV SOLUTION) ×2 IMPLANT
PACK MINOR (CUSTOM PROCEDURE TRAY) ×2 IMPLANT
PAD ARMBOARD 7.5X6 YLW CONV (MISCELLANEOUS) ×4 IMPLANT
SET BASIN LINEN APH (SET/KITS/TRAYS/PACK) ×2 IMPLANT
SET LOCALIZER 20 PROBE US (MISCELLANEOUS) ×2 IMPLANT
SUT MNCRL AB 4-0 PS2 18 (SUTURE) ×4 IMPLANT
SUT SILK 2 0 SH (SUTURE) ×2 IMPLANT
SUT VIC AB 3-0 SH 27 (SUTURE) ×4
SUT VIC AB 3-0 SH 27X BRD (SUTURE) ×2 IMPLANT
SYR BULB IRRIG 60ML STRL (SYRINGE) ×2 IMPLANT
SYR CONTROL 10ML LL (SYRINGE) ×4 IMPLANT

## 2020-12-13 NOTE — Interval H&P Note (Signed)
History and Physical Interval Note:  12/13/2020 8:42 AM  Denise Sanders  has presented today for surgery, with the diagnosis of invasive ductal carcinoma of left breast.  The various methods of treatment have been discussed with the patient and family. After consideration of risks, benefits and other options for treatment, the patient has consented to  Procedure(s): PARTIAL MASTECTOMY WITH AXILLARY SENTINEL LYMPH NODE BIOPSY (Left) BREAST LUMPECTOMY WITH RADIOFREQUENCY TAG IDENTIFICATION (Left) as a surgical intervention.  The patient's history has been reviewed, patient examined, no change in status, stable for surgery.  I have reviewed the patient's chart and labs.  Questions were answered to the patient's satisfaction.     Two tags placed and radiotracer given. No changes. Images reviewed.  Virl Cagey

## 2020-12-13 NOTE — Discharge Instructions (Signed)
Discharge instructions after breast surgery:   Common Complaints: Pain and bruising at the incision sites.  Swelling at the incision sites. Stiffness of the arm.   Diet/ Activity: Diet as tolerated.  You may shower but do not take hot showers as this can disrupt the glue. Rest and listen to your body, but do not remain in bed all day.  Walk everyday for at least 15-20 minutes. Deep cough and move around every 1-2 hours in the first few days after surgery.  Do not lift > 10 lbs for the first 2 weeks after surgery. Do not do anything that makes you feel like you are putting unnecessary pull or stretch on the incision sites.  Do move your arm and shoulder (see exercises options below). If you do not move then you can get stiff and hurt more.  Do not pick at the dermabond glue on your incision sites.  This glue film will remain in place for 1-2 weeks and will start to peel off.  Do not place lotions or balms on your incision unless instructed to specifically by Dr. Constance Haw.   Pain Expectations and Narcotics: -After surgery you will have pain associated with your incisions and this is normal. The pain is muscular and nerve pain, and will get better with time. -You are encouraged and expected to take non narcotic medications like tylenol and ibuprofen (when able) to treat pain as multiple modalities can aid with pain treatment. -Narcotics are only used when pain is severe or there is breakthrough pain. -You are not expected to have a pain score of 0 after surgery, as we cannot prevent pain. A pain score of 3-4 that allows you to be functional, move, walk, and tolerate some activity is the goal. The pain will continue to improve over the days after surgery and is dependent on your surgery. -Due to  law, we are only able to give a certain amount of pain medication to treat post operative pain, and we only give additional narcotics on a patient by patient basis.  -For most laparoscopic surgery,  studies have shown that the majority of patients only need 10-15 narcotic pills, and for open surgeries most patients only need 15-20.   -Having appropriate expectations of pain and knowledge of pain management with non narcotics is important as we do not want anyone to become addicted to narcotic pain medication.  -Using ice packs in the first 48 hours and heating pads after 48 hours, wearing an abdominal binder (when recommended), and using over the counter medications are all ways to help with pain management.   -Simple acts like meditation and mindfulness practices after surgery can also help with pain control and research has proven the benefit of these practices.  Medication: Take tylenol and ibuprofen as needed for pain control, alternating every 4-6 hours.  Example:  Tylenol '1000mg'$  @ 6am, 12noon, 6pm, 91mdnight (Do not exceed '4000mg'$  of tylenol a day). Ibuprofen '800mg'$  @ 9am, 3pm, 9pm, 3am (Do not exceed '3600mg'$  of ibuprofen a day).  Take Roxicodone for breakthrough pain every 4 hours.  Take Colace for constipation related to narcotic pain medication. If you do not have a bowel movement in 2 days, take Miralax over the counter.  Drink plenty of water to also prevent constipation.   Contact Information: If you have questions or concerns, please call our office, [phone] Monday- Thursday 8AM-5PM and Friday 8AM-12Noon.  If it is after hours or on the weekend, please call [facility], [phone]  and ask to speak to the surgeon on call for Dr. Constance Haw at [facility].   Exercises After Breast Surgery Do at least a few of the exercises below twice a day. It is ok to start the day after surgery and gradually build up the amount and type of exercises you do. Link to the exercises with pictures ([email or web address]).   Deep Breathing Exercise Deep breathing can help you relax and ease discomfort and tightness  around your incision (surgical cut). It's also a very good way to relieve stress during the day.  Sit comfortably in a chair. Take a slow, deep breath through your nose. Let your chest and belly expand. Breathe out slowly through your mouth. Repeat as many times as needed.  Arm and Shoulder Exercises Doing arm and shoulder exercises will help you get back your full range of motion on your affected side (the side where you had your surgery). With full range of motion, you'll be able to: Move your arm over your head and out to the side Move your arm behind your neck Move your arm to the middle of your back Do each of the exercises below 5 times a day. Keep doing this until you have a full range of motion again and can use your arm as you did before surgery in all your normal activities. This includes activities at work, at home, and in recreation or sports. If you had limited movement in your arm before surgery, your goal will be to get back as much movement as you had before.  If you get your full range of motion back quickly, keep doing these exercises once a day instead of 5 times a day. This is especially true if you feel any tightness in your chest, shoulder, or under your affected arm. These exercises can help keep scar tissue from forming in your armpit and shoulder. Scar tissue can limit your arm movements later.  If you still have trouble moving your shoulder 4 weeks after your surgery, tell your surgeon. They'll tell you if you need more rehabilitation, such as physical or occupational therapy.  If you had one of the following surgeries, you can do the following set of exercises on the first day after your surgery, as long as your surgeon tells you it's safe.  Shoulder rolls The shoulder roll is a good exercise to start with because it gently stretches your chest and shoulder muscles.  Stand or sit comfortably with your arms relaxed at your sides. Start with backward shoulder rolls.  In a circular motion, bring your shoulders forward, up, backward, and down. Do this 10 times. Switch directions and do 10 forward shoulder rolls. Bring your shoulders backward, up, forward, and down. Do this 10 times. Try to make the circles as big as you can and move both shoulders at the same time. If you have some tightness across your incision or chest, start with smaller circles and make them bigger as the tightness decreases. The backward direction might feel a little tighter across your chest than the forward direction. This will get better with practice.  Shoulder wings The shoulder wings exercise will help you get back outward movement of your shoulder. You can do this exercise while sitting or standing.  Place your hands on your chest or collarbone. Raise your elbows out to the side, limiting your range of motion as instructed by your healthcare team. Slowly lower your elbows. Do this 10 times. Then, slowly lower your hands. If you feel  discomfort while doing this exercise, hold your position and do the deep breathing exercise. If the discomfort passes, raise your elbows a little higher. If it doesn't pass, don't raise your elbows any higher. Finish the exercise raising your elbows only high enough to feel a gentle stretch and no discomfort.  Arm circles If you had surgery on both breasts, do this exercise with both arms, 1 arm at a time. Don't do this exercise with both arms at the same time. This will put too much pressure on your chest.  Stand with your feet slightly apart for balance. Raise your affected arm out to the side as high as you can, limiting your range of movement as instructed by your healthcare team. Start making slow, backward circles in the air with your arm. Make sure you're moving your arm from your shoulder, not your elbow. Keep your elbow straight. Increase the size of the circles until they're as big as you can comfortably make them, limiting your range of motion  as instructed by your healthcare team. If you feel any aching or if your arm is tired, take a break. Keep doing the exercise when you feel better. Do 10 full backward circles. Then, slowly lower your arm to your side. Rest your arm for a moment. Follow steps 1 to 4 again, but this time make slow, forward circles.  W exercise You can do the W exercise while sitting or standing.  Form a "W" with your arms out to the side and palms facing forward (see Figure 4). Try to bring your hands up so they're even with your face. If you can't raise your arms that high, bring them to the highest comfortable position. Make sure to limit your range of motion as instructed by your healthcare team. Pinch your shoulder blades together and downward, as if you're squeezing a pencil between them. If you feel discomfort, stop at that position and do the deep breathing exercise. If the discomfort passes, try to bring your arms back a little further. If it doesn't pass, don't reach any further. Hold the furthest position that doesn't cause discomfort. Squeeze your shoulder blades together and downward for 5 seconds. Slowly bring your arms back down to the starting position. Repeat this movement 10 times.  Back Climb You can do the back climb stretch while sitting or standing. You'll need a timer or stopwatch.  Place your hands behind your back. Hold the hand on your affected side with your other hand. If you had surgery on both breasts, use the arm that moves most easily to hold the other. Slowly slide your hands up the center of your back as far as you can. If you feel tightness near your incision, stop at that position and do the deep breathing exercise. If the tightness decreases, try to slide your hands up a little further. If it doesn't decrease, don't slide your hands up any further. Hold the highest position you can for 1 minute. Use your stopwatch or timer to keep track. You should feel a gentle stretch in your  shoulder area. After 1 minute, slowly lower your hands.  Hands behind neck You can do the hands behind neck stretch while sitting or standing. You'll need a timer or stopwatch.  Clasp your hands together on your lap or in front of you. Slowly raise your hands toward your head, keeping your elbows together in front of you, not out to the sides. Keep your head level. Don't bend your neck or  head forward. Slide your hands over your head until you reach the back of your neck. When you get to this point, spread your elbows out to the sides. Hold this position for 1 minute. Use your stopwatch or timer to keep track. Breathe normally. Don't hold your breath as you stretch your body. If you have some tightness across your incision or chest, hold your position and do the deep breathing exercise. If the tightness decreases, continue with the movement. If the tightness stays the same, reach up and stretch your elbows back as best as you can without causing discomfort. Hold the position you're most comfortable in for 1 minute. Slowly come out of the stretch by bringing your elbows together and sliding your hands over your head. Then, slowly lower your arms.  Forward wall crawls You'll need 2 pieces of tape for the forward wall crawl exercise.  Stand facing a wall. Your toes should be about 6 inches (15 centimeters) from the wall. Reach as high as you can with your unaffected arm. Elta Gu[place] that point with a piece of tape. This will be the goal for your affected arm. If you had surgery on both breasts, set your goal using the arm that moves most comfortably. Place both hands against the wall at a level that's comfortable. Crawl your fingers up the wall as far as you can, keeping them even with each other.. Try not to look up toward your hands or arch your back. When you get to the point where you feel a good stretch, but not pain, do the deep breathing exercise. Return to the starting position by crawling your  fingers back down the wall. Repeat the wall crawl 10 times. Each time you raise your hands, try to crawl a little bit higher. On the 10th crawl, use the other piece of tape to mark the highest point you reached with your affected arm. This will let you to see your progress each time you do this exercise. As you become more flexible, you may need to take a step closer to the wall so you can reach a little higher.   Side wall crawls You'll also need 2 pieces of tape for the side wall crawl exercise.  You shouldn't feel pain while doing this exercise. It's normal to feel some tightness or pulling across the side of your chest. Focus on your breathing until the tightness decreases. Breathe normally throughout this exercise. Don't hold your breath.  Be careful not to turn your body toward the wall while doing this exercise. Make sure only the side of your body faces the wall.  If you had surgery on both breasts, start with step 3.  Stand with your unaffected side closest to the wall, about 1 foot (30.5 centimeters) away from the wall. Reach as high as you can with your unaffected arm. Elta [place] that point with a piece of tape (see Figure 8). This will be the goal for your affected arm. Turn your body so your affected side is now closest to the wall. If you had surgery on both breasts, start with either side closest to the wall. Crawl your fingers up the wall as far as you can. When you get to the point where you feel a good stretch, but not pain, do the deep breathing exercise. Return to the starting position by crawling your fingers back down the wall. Repeat this exercise 10 times. On your 10th crawl, use a piece of tape to mark the highest point you  reached with your affected arm. This will let you see your progress each time you do the exercise. If you had surgery on both breasts, repeat the exercise with your other arm.  Swelling After your surgery, you may have some swelling or puffiness in your  hand or arm on your affected side. This is normal and usually goes away on its own.  If you notice swelling in your hand or arm, follow the tips below to help the swelling go away.  Raise your arm above your head and do hand pumps several times a day. To do hand pumps, slowly open and close your fist 10 times. This will help drain the fluid out of your arm. Don't hold your arm straight up over your head for more than a few minutes. This can cause your arm muscles to get tired. Raise your arm to the side a few times a day for about 20 minutes at a time. To do this, sit or lie down on your back. Rest your arm on a few pillows next to you so it's raised above the level of your heart. If you're able to sleep on your unaffected side, you can place 1 or 2 pillows in front of you and rest your affected arm on them while you sleep. If the swelling doesn't go down within 4 to 6 weeks, call your surgeon or nurse.

## 2020-12-13 NOTE — Anesthesia Postprocedure Evaluation (Signed)
Anesthesia Post Note  Patient: CANDI ALTIMARI  Procedure(s) Performed: PARTIAL MASTECTOMY WITH AXILLARY SENTINEL LYMPH NODE BIOPSY (Left: Breast) BREAST LUMPECTOMY WITH RADIOFREQUENCY TAG IDENTIFICATION (Left: Breast)  Patient location during evaluation: PACU Anesthesia Type: General Level of consciousness: awake and alert and oriented Pain management: pain level controlled Vital Signs Assessment: post-procedure vital signs reviewed and stable Respiratory status: spontaneous breathing and respiratory function stable Cardiovascular status: blood pressure returned to baseline and stable Postop Assessment: no apparent nausea or vomiting Anesthetic complications: no   No notable events documented.   Last Vitals:  Vitals:   12/13/20 1515 12/13/20 1530  BP: (!) 165/76 (!) 151/105  Pulse: 75 86  Resp: 12 14  Temp:    SpO2: 96% 97%    Last Pain:  Vitals:   12/13/20 1537  TempSrc:   PainSc: 8                  Renda Pohlman C Terriyah Westra

## 2020-12-13 NOTE — Anesthesia Procedure Notes (Signed)
Procedure Name: LMA Insertion Date/Time: 12/13/2020 12:48 PM Performed by: Orlie Dakin, CRNA Pre-anesthesia Checklist: Patient identified, Emergency Drugs available, Suction available and Patient being monitored Patient Re-evaluated:Patient Re-evaluated prior to induction Oxygen Delivery Method: Circle system utilized Preoxygenation: Pre-oxygenation with 100% oxygen Induction Type: IV induction Ventilation: Mask ventilation without difficulty LMA: LMA inserted LMA Size: 4.0 Tube type: Oral Number of attempts: 1 Placement Confirmation: positive ETCO2 Tube secured with: Tape Dental Injury: Teeth and Oropharynx as per pre-operative assessment

## 2020-12-13 NOTE — Op Note (Signed)
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 Constance Haw, MD   Assistants: No qualified resident was available    Anesthesia: General endotracheal   Anesthesiologist: Dr. Charna Elizabeth    Specimens: Left axillary nodes (2 nodes, not hot or blue); left breast mass painted    Estimated Blood Loss: Minimal   Blood Replacement: None    Complications: None   Wound Class: Clean    Operative Indications: Ms. Gearty is a 65 yo with left breast cancer and a second area of ductal carcinoma in situ. She had these areas biopsied and had radiofrequency tags placed prior to the operation. We discussed partial mastectomy, need for radiation, sentinel lymph node biopsy and risk of bleeding, infection, allergy to blue dye, finding cancer that requires more surgery, cosmetic changes, and potential need for chemotherapy or additional therapies.    Findings: Two small lymph nodes noted (not hot or blue); breast mass with 2 radiofrequency tags and 2 biopsy clips seen by mammogram of specimen    Procedure: The patient was taken to the operating room and placed supine. General endotracheal anesthesia was induced. Intravenous antibiotics were administered per protocol.  Radiotracer had been placed around her nipple in the preoperative area. Blue dye was placed at 12, 3, 6, and 9 o'clock in the standard fashion. The breast was massaged for 5 minutes.  The left breast and axilla were prepared and draped in the usual sterile fashion.   The gamma probe was used to identify the hottest spot in the axilla. The incision was made just below the hairline and carried down with cautery.  The space was opened and  I could not find any blue or hot nodes. The gamma probe readings were in the 10 range despite opening up into deeper tissue and dissecting out with Metzenbaum scissors.  The gamma probe was tested at the areola and the radiotracer was hot in this region with adequate readings.   I found 2 small lymph nodes with palpation and resected these with scissors and applied clips around the lymph nodes as I dissected them out.  No additional hot or blue nodes were identified.  A raytec was placed in the axilla for hemostasis.   The radiofrequency tag localizer was then used to identify both of the tags and her imaging with the tags was displayed. She had two tags in the central portion of her breast more superior and more retro-areolar.  I opted for a superior circumareolar incision. Flaps were created superior to the nipple and beneath the nipple. The mass was excised using the localizer to ensure that adequate margins were maintained from the tags.  My closest margin with the localizer probe was 1 cm. The mass was removed with both the clips and tags in place, and the mass was painted in the standard fashion with paint. The specimen was passed off the field and taken to mammogram where a specimen mammogram confirmed 2 tags and 2 clips.   The axilla was ensured to be hemostatic and the raytec was removed. The deep space was closed with 3-0 Interrupted Vicryl the skin was closed with 4-0 Monocryl subcuticular. The breast incision was hemostatic and was closed with 3-0 Vicryl interrupted in the dermal  layer/ flaps to help create a space for seroma formation deep, and the skin was closed with 4-0 Monocryl subcuticular.  Both incisions were closed with dermabond after Bupivacaine was injected.   Final inspection revealed acceptable hemostasis. All counts were correct at the end of the case. The patient was awakened from anesthesia and extubated without complication.  The patient went to the PACU in stable condition.    Curlene Labrum, MD [facility] [address] [place], [place] [address] [phone] (office)

## 2020-12-13 NOTE — Anesthesia Preprocedure Evaluation (Signed)
Anesthesia Evaluation  Patient identified by MRN, date of birth, ID band Patient awake    Reviewed: Allergy & Precautions, NPO status , Patient's Chart, lab work & pertinent test results  Airway Mallampati: II  TM Distance: >3 FB Neck ROM: Full    Dental  (+) Dental Advisory Given, Missing   Pulmonary Current SmokerPatient did not abstain from smoking.,    Pulmonary exam normal breath sounds clear to auscultation       Cardiovascular Exercise Tolerance: Good hypertension, Pt. on medications Normal cardiovascular exam Rhythm:Regular Rate:Normal     Neuro/Psych negative neurological ROS  negative psych ROS   GI/Hepatic GERD  ,(+)     substance abuse  alcohol use,   Endo/Other  diabetes, Well Controlled, Type 2, Oral Hypoglycemic AgentsHypothyroidism   Renal/GU negative Renal ROS     Musculoskeletal  (+) Arthritis ,   Abdominal   Peds  Hematology negative hematology ROS (+)   Anesthesia Other Findings   Reproductive/Obstetrics                             Anesthesia Physical Anesthesia Plan  ASA: 2  Anesthesia Plan: General   Post-op Pain Management:    Induction:   PONV Risk Score and Plan: 3  Airway Management Planned: LMA  Additional Equipment:   Intra-op Plan:   Post-operative Plan: Extubation in OR  Informed Consent: I have reviewed the patients History and Physical, chart, labs and discussed the procedure including the risks, benefits and alternatives for the proposed anesthesia with the patient or authorized representative who has indicated his/her understanding and acceptance.     Dental advisory given  Plan Discussed with: CRNA and Surgeon  Anesthesia Plan Comments:         Anesthesia Quick Evaluation

## 2020-12-13 NOTE — Progress Notes (Signed)
[facility]  Updated son about surgery. Will call with pathology. Rx to [place], MD [facility] [address] [place], [place] [address] [phone] (office)

## 2020-12-13 NOTE — Transfer of Care (Signed)
Immediate Anesthesia Transfer of Care Note  Patient: Denise Sanders  Procedure(s) Performed: PARTIAL MASTECTOMY WITH AXILLARY SENTINEL LYMPH NODE BIOPSY (Left: Breast) BREAST LUMPECTOMY WITH RADIOFREQUENCY TAG IDENTIFICATION (Left: Breast)  Patient Location: PACU  Anesthesia Type:General  Level of Consciousness: awake and patient cooperative  Airway & Oxygen Therapy: Patient Spontanous Breathing  Post-op Assessment: Report given to RN and Post -op Vital signs reviewed and stable  Post vital signs: Reviewed and stable  Last Vitals:  Vitals Value Taken Time  BP 162/89 12/13/20 1506  Temp 98   Pulse 79 12/13/20 1508  Resp 16 12/13/20 1508  SpO2 92 % 12/13/20 1508  Vitals shown include unvalidated device data.  Last Pain:  Vitals:   12/13/20 0836  TempSrc: Oral  PainSc: 0-No pain         Complications: No notable events documented.

## 2020-12-14 ENCOUNTER — Telehealth: Payer: Self-pay | Admitting: Family Medicine

## 2020-12-14 ENCOUNTER — Encounter (HOSPITAL_COMMUNITY): Payer: Self-pay | Admitting: General Surgery

## 2020-12-14 NOTE — Telephone Encounter (Signed)
FMLA paperwork completed and faxed to [employer] at [phone] and received confirmation.  Pt out of work from 12/13/2020 with return to work 01/01/2021 without restrictions. (Per Dr. Constance Haw).

## 2020-12-18 LAB — SURGICAL PATHOLOGY

## 2020-12-28 ENCOUNTER — Other Ambulatory Visit: Payer: Self-pay

## 2020-12-28 ENCOUNTER — Ambulatory Visit (INDEPENDENT_AMBULATORY_CARE_PROVIDER_SITE_OTHER): Payer: BC Managed Care – PPO | Admitting: General Surgery

## 2020-12-28 ENCOUNTER — Encounter: Payer: Self-pay | Admitting: General Surgery

## 2020-12-28 VITALS — BP 135/83 | HR 91 | Temp 97.7°F | Resp 16 | Ht 63.0 in | Wt 143.0 lb

## 2020-12-28 DIAGNOSIS — C50912 Malignant neoplasm of unspecified site of left female breast: Secondary | ICD-10-CM

## 2020-12-28 MED ORDER — OXYCODONE HCL 5 MG PO TABS: 5.0000 mg | ORAL_TABLET | ORAL | 0 refills | Status: AC | PRN

## 2020-12-28 NOTE — Patient Instructions (Signed)
Continue to move your arm and exercise /stretch that area. Will extend FMLA to 01/15/2021. Will refer you to Oncology at [facility] and they will refer you to Radiation Oncology in [place].

## 2020-12-29 NOTE — Progress Notes (Signed)
[facility]  Patient s/p left breast partial mastectomy and sentinel node biopsy. Doing well and having some pain under her axilla. Otherwise no major issues.   BP 135/83   Pulse 91   Temp 97.7 F (36.5 C) (Other (Comment))   Resp 16   Ht '5\' 3"'$  (1.6 m)   Wt 143 lb (64.9 kg)   SpO2 97%   BMI 25.33 kg/m  Incisions intact and no erythema or drainage, bruising noted   Continue to move your arm and exercise /stretch that area. Will extend FMLA to 01/15/2021. Will refer you to Oncology at [facility] and they will refer you to Radiation Oncology in [place].  Future Appointments  Date Time Provider [place]  01/01/2021  8:15 AM Derek Jack, MD [facility] None   Curlene Labrum, MD [facility] [address] [place], [place] [address] [phone] (office)

## 2020-12-30 NOTE — Progress Notes (Signed)
[facility] [address] [place] [address]   Patient Care Team: Leonie Douglas, MD as PCP - General (Family Medicine) Brien Mates, RN as Oncology Nurse Navigator (Oncology) Derek Jack, MD as Medical Oncologist (Oncology)  CHIEF COMPLAINTS/PURPOSE OF CONSULTATION:  Newly diagnosed left breast cancer  HISTORY OF PRESENTING ILLNESS:  Denise Sanders 65 y.o. female is here because of recent diagnosis of left breast cancer.  Today she reports feeling well. She underwent a left breast lumpectomy on 12/13/20 with Dr. Constance Haw. She denies any previous history of cancer. She works at a daycare, and she currently lives in [place]. She has smoked 1 ppd for about 40 years. Her brother had prostate cancer, and her father had an unspecified cancer. Her sister was suspected for breast cancer but did not pursue a diagnosis.   In terms of breast cancer risk profile:  She menarched at early age of 58 and went to menopause at age 54  She had 3 pregnancy, her first child was born at age 57  She was exposed to fertility medications or hormone replacement therapy for 1 month prior to her hysterectomy in 2019.  She has family history of Breast/GYN/GI cancer  I reviewed her records extensively and collaborated the history with the patient.  SUMMARY OF ONCOLOGIC HISTORY: Oncology History   No history exists.    MEDICAL HISTORY:  Past Medical History:  Diagnosis Date   Arthritis    Diabetes mellitus without complication (HCC)    GERD (gastroesophageal reflux disease)    Hypertension    Hypothyroidism     SURGICAL HISTORY: Past Surgical History:  Procedure Laterality Date   ABDOMINAL HYSTERECTOMY     ANTERIOR AND POSTERIOR REPAIR N/A 09/22/2017   Procedure: ANTERIOR (CYSTOCELE) AND POSTERIOR REPAIR (RECTOCELE);  Surgeon: Schermerhorn, Gwen Her, MD;  Location: [facility] ORS;  Service: Gynecology;  Laterality: N/A;   APPENDECTOMY     BILATERAL SALPINGECTOMY Bilateral  09/22/2017   Procedure: BILATERAL SALPINGECTOMY;  Surgeon: Schermerhorn, Gwen Her, MD;  Location: [facility] OR[facility];  Service: Gynecology;  Laterality: Bilateral;   BREAST LUMPECTOMY WITH RADIOFREQUENCY TAG IDENTIFICATION Left 1/60/7371   Procedure: BREAST LUMPECTOMY WITH RADIOFREQUENCY TAG IDENTIFICATION;  Surgeon: Virl Cagey, MD;  Location: [facility];  Service: General;  Laterality: Left;   CATARACT EXTRACTION W/PHACO Right 08/25/2020   Procedure: CATARACT EXTRACTION PHACO AND INTRAOCULAR LENS PLACEMENT RIGHT EYE;  Surgeon: Baruch Goldmann, MD;  Location: [facility];  Service: Ophthalmology;  Laterality: Right;  CDE  7.01   CATARACT EXTRACTION W/PHACO Left 10/20/2020   Procedure: CATARACT EXTRACTION PHACO AND INTRAOCULAR LENS PLACEMENT (IOC);  Surgeon: Baruch Goldmann, MD;  Location: [facility];  Service: Ophthalmology;  Laterality: Left;  CDE: 7.61   COLONOSCOPY WITH PROPOFOL N/A 12/16/2017   Procedure: COLONOSCOPY WITH PROPOFOL;  Surgeon: Toledo, Benay Pike, MD;  Location: [facility] END[facility];  Service: Gastroenterology;  Laterality: N/A;  (+) DM - oral   PARTIAL MASTECTOMY WITH AXILLARY SENTINEL LYMPH NODE BIOPSY Left 12/13/2020   Procedure: PARTIAL MASTECTOMY WITH AXILLARY SENTINEL LYMPH NODE BIOPSY;  Surgeon: Virl Cagey, MD;  Location: [facility];  Service: General;  Laterality: Left;   TRACHEOSTOMY  2018   VAGINAL HYSTERECTOMY N/A 09/22/2017   Procedure: HYSTERECTOMY VAGINAL;  Surgeon: Boykin Nearing, MD;  Location: [facility] OR[facility];  Service: Gynecology;  Laterality: N/A;    SOCIAL HISTORY: Social History   Socioeconomic History   Marital status: Single    Spouse name: Not on file   Number of children:  Not on file   Years of education: Not on file   Highest education level: Not on file  Occupational History   Not on file  Tobacco Use   Smoking status: Every Day    Packs/day: 0.50    Years: 39.00    Pack years: 19.50    Types: Cigarettes   Smokeless tobacco: Never  Vaping Use   Vaping Use: Former   Substance and Sexual Activity   Alcohol use: Yes    Alcohol/week: 7.0 standard drinks    Types: 7 Glasses of wine per week   Drug use: No   Sexual activity: Yes  Other Topics Concern   Not on file  Social History Narrative   Not on file   Social Determinants of Health   Financial Resource Strain: Medium Risk   Difficulty of Paying Living Expenses: Somewhat hard  Food Insecurity: No Food Insecurity   Worried About Charity fundraiser in the Last Year: Never true   Ran Out of Food in the Last Year: Never true  Transportation Needs: No Transportation Needs   Lack of Transportation (Medical): No   Lack of Transportation (Non-Medical): No  Physical Activity: Inactive   Days of Exercise per Week: 0 days   Minutes of Exercise per Session: 0 min  Stress: No Stress Concern Present   Feeling of Stress : Not at all  Social Connections: Moderately Isolated   Frequency of Communication with Friends and Family: More than three times a week   Frequency of Social Gatherings with Friends and Family: More than three times a week   Attends Religious Services: More than 4 times per year   Active Member of [employer] or Organizations: No   Attends Archivist Meetings: Never   Marital Status: Never married  Human resources officer Violence: Not At Risk   Fear of Current or Ex-Partner: No   Emotionally Abused: No   Physically Abused: No   Sexually Abused: No    FAMILY HISTORY: History reviewed. No pertinent family history.  ALLERGIES:  has No Known Allergies.  MEDICATIONS:  Current Outpatient Medications  Medication Sig Dispense Refill   acetaminophen (TYLENOL) 500 MG tablet Take 1,000 mg by mouth every 6 (six) hours as needed for moderate pain or headache.     albuterol (VENTOLIN HFA) 108 (90 Base) MCG/ACT inhaler 1 puff as needed for cough or shortness of breath     atorvastatin (LIPITOR) 20 MG tablet Take 20 mg by mouth daily.     buPROPion ER (WELLBUTRIN SR) 100 MG 12 hr tablet Take  100 mg by mouth daily.     gabapentin (NEURONTIN) 100 MG capsule Take 100-200 mg by mouth 2 (two) times daily as needed (for cramping/pain.).     glipiZIDE (GLUCOTROL) 10 MG tablet Take 10 mg by mouth daily before breakfast.     ibuprofen (ADVIL) 200 MG tablet Take 400 mg by mouth every 6 (six) hours as needed for headache or moderate pain.     levothyroxine (SYNTHROID, LEVOTHROID) 100 MCG tablet Take 100 mcg by mouth daily before breakfast.  11   lisinopril (ZESTRIL) 5 MG tablet Take 5 mg by mouth daily.     melatonin 5 MG TABS Take 10-15 mg by mouth at bedtime as needed (sleep).     metFORMIN (GLUCOPHAGE-XR) 500 MG 24 hr tablet Take 500 mg by mouth 2 (two) times daily.  1   oxyCODONE (ROXICODONE) 5 MG immediate release tablet Take 1 tablet (5 mg total) by mouth  every 4 (four) hours as needed for severe pain or breakthrough pain. 10 tablet 0   Semaglutide,0.25 or 0.5MG/DOS, (OZEMPIC, 0.25 OR 0.5 MG/DOSE,) 2 MG/1.5ML SOPN Inject 0.5 mg into the skin once a week.     No current facility-administered medications for this visit.    REVIEW OF SYSTEMS:   Review of Systems  Constitutional:  Negative for appetite change (80%) and fatigue (80%).  Respiratory:  Positive for cough.   Psychiatric/Behavioral:  Positive for sleep disturbance.   All other systems reviewed and are negative.  PHYSICAL EXAMINATION: ECOG PERFORMANCE STATUS: 0 - Asymptomatic  Vitals:   01/01/21 0751  BP: 122/78  Pulse: 77  Resp: 18  Temp: (!) 97 F (36.1 C)  SpO2: 98%   Filed Weights   01/01/21 0751  Weight: 143 lb 11.2 oz (65.2 kg)   Physical Exam Vitals reviewed.  Constitutional:      Appearance: Normal appearance.  Cardiovascular:     Rate and Rhythm: Normal rate and regular rhythm.     Pulses: Normal pulses.     Heart sounds: Normal heart sounds.  Pulmonary:     Effort: Pulmonary effort is normal.     Breath sounds: Normal breath sounds.  Chest:  Breasts:    Right: Normal.     Left: Skin change  (lumpectomy scar around aerola well healed) present.  Abdominal:     Palpations: Abdomen is soft. There is no hepatomegaly, splenomegaly or mass.     Tenderness: There is no abdominal tenderness.  Musculoskeletal:     Right lower leg: No edema.     Left lower leg: No edema.  Neurological:     General: No focal deficit present.     Mental Status: She is alert and oriented to person, place, and time.  Psychiatric:        Mood and Affect: Mood normal.        Behavior: Behavior normal.    Breast Exam Chaperone: Thana Ates    LABORATORY DATA:  I have reviewed the data as listed Recent Results (from the past 2160 hour(s))  Glucose, capillary     Status: Abnormal   Collection Time: 10/20/20 11:22 AM  Result Value Ref Range   Glucose-Capillary 122 (H) 70 - 99 mg/dL    Comment: Glucose reference range applies only to samples taken after fasting for at least 8 hours.  Hemoglobin A1c     Status: Abnormal   Collection Time: 12/12/20 10:17 AM  Result Value Ref Range   Hgb A1c MFr Bld 6.8 (H) 4.8 - 5.6 %    Comment: (NOTE) Pre diabetes:          5.7%-6.4%  Diabetes:              >6.4%  Glycemic control for   <7.0% adults with diabetes    Mean Plasma Glucose 148.46 mg/dL    Comment: Performed at [facility] [address] [place], [place] [address]  Basic metabolic panel     Status: Abnormal   Collection Time: 12/12/20 10:17 AM  Result Value Ref Range   Sodium 139 135 - 145 mmol/L   Potassium 4.1 3.5 - 5.1 mmol/L   Chloride 104 98 - 111 mmol/L   CO2 26 22 - 32 mmol/L   Glucose, Bld 57 (L) 70 - 99 mg/dL    Comment: Glucose reference range applies only to samples taken after fasting for at least 8 hours.   BUN 13 8 - 23 mg/dL  Creatinine, Ser 0.74 0.44 - 1.00 mg/dL   Calcium 9.3 8.9 - 10.3 mg/dL   GFR, Estimated >60 >60 mL/min    Comment: (NOTE) Calculated using the CKD-EPI Creatinine Equation (2021)    Anion gap 9 5 - 15    Comment: Performed at [facility], [address] [place], [place] [address]  Glucose, capillary     Status: Abnormal   Collection Time: 12/13/20  9:11 AM  Result Value Ref Range   Glucose-Capillary 140 (H) 70 - 99 mg/dL    Comment: Glucose reference range applies only to samples taken after fasting for at least 8 hours.  Surgical pathology     Status: None   Collection Time: 12/13/20  1:27 PM  Result Value Ref Range   SURGICAL PATHOLOGY      SURGICAL PATHOLOGY CASE: [id number] PATIENT: Denise Sanders Surgical Pathology Report     Clinical History: invasive ductal carcinoma of left breast     FINAL MICROSCOPIC DIAGNOSIS:  A. 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 LYMPH NODE, SENTINEL, BIOPSY: - Lymph node, negative for carcinoma (0/1)  C. LYMPH NODE, SENTINEL, BIOPSY: - Lymph node, negative for carcinoma (0/1)       ONCOLOGY TABLE:  INVASIVE CARCINOMA OF THE BREAST:  Resection  Procedure: Lumpectomy Specimen Laterality: Left Histologic Type: Invasive ductal carcinoma Histologic Grade:      Glandular (Acinar)/Tubular Differentiation: 2      Nuclear Pleomorphism: 2      Mitotic Rate: 1      Overall Grade:  1 Tumor Size: 1.3 cm Ductal Carcinoma In Situ: Present, intermediate grade with focal necrosis and calcifications Treatment Effect in the Breast: No known presurgical therapy Margins: All margins negative for invasive carcinoma      Distance from Closest Margin (mm): 5 mm      Specify Closest Margin (required only if <58m): Anterior margin DCIS Margins: Uninvolved by DCIS      Distance from Closest Margin (mm): 6 mm      Specify Closest Margin (required only if <170m: Anterior margin Regional Lymph  Nodes:      Number of Lymph Nodes Examined: 2      Number of Sentinel Nodes Examined: 2      Number of Lymph Nodes with Macrometastases (>2 mm): 0      Number of Lymph Nodes with Micrometastases: 0      Number of Lymph Nodes with Isolated Tumor Cells (=0.2 mm or =200 cells): 0      Size of Largest Metastatic Deposit (mm): Not applicable      Extranodal Extension: Not applicable Distant Metastasis:      Distant Site(s) Involved: Not applicable Breast Biomarker Testing  Performed on Previous Biopsy:      Testing Performed on Case Number: [id number]            Estrogen Receptor: > 95%, positive, strong staining intensity            Progesterone Receptor: 30%, positive, moderate staining intensity            HER2: Negative (FISH)            Ki-67: 5% Pathologic Stage Classification (pTNM, AJCC 8th Edition): pT1c, pN0 Representative Tumor Block: A3  Comment(s): None  (v4.5.0.0)      GROSS DESCRIPTION:  Specimen type: Left breast lumpectomy, received in formalin, time in formalin 2:40 PM on 12/13/2020. Size: 7.8 cm at the superior-inferior axis, 5.7 cm at the anterior-posterior axis and 2.7 cm at the lateral-medial axis. Orientation: The specimen is oriented with previously applied inks (anterior green, inferior blue, lateral orange, medial yellow, posterior black, superior red). Localized area: The specimen radiograph shows 2 radiofrequency localization devices. Cut surface: There are 2 lesions present.  In the central portio n of the specimen there is a 1.3 x 1.2 x 1 cm indurated white nodule with ill-defined borders.  There is a ribbon shaped biopsy clip present.  The second lesion consists of a 1 x 1 x 0.8 cm stellate area of hemorrhage consistent with a biopsy site and is located 2 cm from the ribbon clip nodule.  There is an X shaped biopsy clip present.  The remainder the breast tissue consists of soft yellow adipose tissue and focally dense white fibrous  tissue. Margins: The nodule at the ribbon biopsy clip is located 0.5 cm from the junction of the medial and anterior margin and 0.8 cm from the medial margin.  The remaining margins measure greater than 1 cm.  The hemorrhage at the biopsy site (X clip) extends to the posterior margin. The remaining margins measure greater than 1 cm. Prognostic indicators: Obtained from paraffin blocks if needed. Block summary: 11 blocks submitted 1-3 = sections of nodule (ribbon clip) with anterior and lateral margins 4 = nodule with medial margin 5  = section between ribbon clip and X clip 6-9 = entire biopsy site at X clip with posterior margin 10 = anterior margin 11 = superior and inferior margins  B: Received in formalin is a 1 x 0.7 x 0.6 cm rubbery tan-yellow nodule. The specimen is bisected and entirely submitted in 1 cassette.  C: Received in formalin is a 1.1 x 0.9 x 0.7 cm rubbery tan-yellow nodule.  The specimen is sectioned and entirely submitted in 1 cassette. [facility] 12/15/2020)    Final Diagnosis performed by Jaquita Folds, MD.   Electronically signed 12/18/2020 Technical component performed at [facility], [place] [address] [place], [place] [address].  Professional component performed at [facility], [place] [address] [place], [place] [address].  Immunohistochemistry Technical component (if applicable) was performed at [facility]. [address], [place], [place], [place] [address].   IMMUNOHISTOCHEMISTRY DISCLAIMER (if applicable): Some o f these immunohistochemical stains may have been developed and the performance characteristics determine by [facility]. Some may not have been cleared or approved by the U.S. Food and Drug Administration. The FDA has determined that such clearance or approval is not necessary. This test is used for clinical purposes. It should not be regarded as investigational or for  research. This laboratory is certified under the [place] (CLIA-88) as qualified to perform high complexity clinical laboratory testing.  The controls stained appropriately.   Glucose, capillary     Status: Abnormal   Collection Time: 12/13/20  3:13 PM  Result Value Ref Range   Glucose-Capillary 134 (H) 70 - 99 mg/dL    Comment: Glucose reference range applies only to samples taken after fasting for at least 8 hours.    RADIOGRAPHIC STUDIES: I have personally reviewed the radiological reports and agreed with the findings in the report. NM Sentinel Node Inj-No Rpt (Breast)  Result Date: 12/13/2020 Sulfur  Colloid was injected by the Nuclear Medicine Technologist for sentinel lymph node localization.   MM BREAST SURGICAL SPECIMEN  Result Date: 12/13/2020 CLINICAL DATA:  Status post radiofrequency tag localized left breast lumpectomy. EXAM: SPECIMEN RADIOGRAPH OF THE LEFT BREAST COMPARISON:  Previous exam(s). FINDINGS: Status post excision of the left breast. Both radiofrequency tags and the adjacent X shaped and ribbon shaped biopsy clips are intact and within the specimen. IMPRESSION: Specimen radiograph of the left breast. Electronically Signed   By: Lajean Manes M.D.   On: 12/13/2020 14:38  MM LT RADIO FREQUENCY TAG LOC MAMMO GUIDE  Result Date: 12/12/2020 CLINICAL DATA:  65 year old female with recently diagnosed invasive ductal carcinoma in the left breast at site of ribbon shaped biopsy marking clip in high-grade ductal carcinoma in situ of the left breast at site of X shaped biopsy marking clip presents for preoperative radiofrequency tag localization. EXAM: RADIOFREQUENCY TAG LOCALIZATION OF THE LEFT BREAST WITH MAMMO GUIDANCE COMPARISON:  Previous exams. FINDINGS: Patient presents for needle localization prior to left breast lumpectomy. I met with the patient and we discussed the procedure of needle localization including benefits and  alternatives. We discussed the high likelihood of a successful procedure. We discussed the risks of the procedure, including infection, bleeding, tissue injury, and further surgery. Informed, written consent was given. The usual time-out protocol was performed immediately prior to the procedure. SITE 1: LEFT BREAST UPPER OUTER INVASIVE DUCTAL CARCINOMA/RIBBON SHAPED BIOPSY MARKING CLIP: Using mammographic guidance, sterile technique, 1% lidocaine and a 7 cm RF tag needle, the the mass with associated ribbon shaped biopsy marking clip was localized using a lateral to medial approach. RF tag function was confirmed. The images were marked for [place]. SITE 2: LEFT BREAST RETROAREOLAR HIGH-GRADE DUCTAL CARCINOMA IN SITU/X SHAPED BIOPSY MARKING CLIP: Using mammographic guidance, sterile technique, 1% lidocaine and a 7 cm RF tag needle, the X shaped biopsy marking clip was localized using a lateral to medial approach. RF tag function was confirmed. The images were marked for [place]. IMPRESSION: Radiofrequency tag localization of the left breast, at site invasive ductal carcinoma/ribbon shaped biopsy marking clip and also at site of high-grade ductal carcinoma/X shaped biopsy marking clip. Electronically Signed   By: Everlean Alstrom M.D.   On: 12/12/2020 10:01  MM LT RADIO FREQUENCY TAG EA ADD LESION LOC MAMMO GUIDE  Result Date: 12/12/2020 CLINICAL DATA:  65 year old female with recently diagnosed invasive ductal carcinoma in the left breast at site of ribbon shaped biopsy marking clip in high-grade ductal carcinoma in situ of the left breast at site of X shaped biopsy marking clip presents for preoperative radiofrequency tag localization. EXAM: RADIOFREQUENCY TAG LOCALIZATION OF THE LEFT BREAST WITH MAMMO GUIDANCE COMPARISON:  Previous exams. FINDINGS: Patient presents for needle localization prior to left breast lumpectomy. I met with the patient and we discussed the procedure of needle localization including  benefits and alternatives. We discussed the high likelihood of a successful procedure. We discussed the risks of the procedure, including infection, bleeding, tissue injury, and further surgery. Informed, written consent was given. The usual time-out protocol was performed immediately prior to the procedure. SITE 1: LEFT BREAST UPPER OUTER INVASIVE DUCTAL CARCINOMA/RIBBON SHAPED BIOPSY MARKING CLIP: Using mammographic guidance, sterile technique, 1% lidocaine and a 7 cm RF tag needle, the the mass with associated ribbon shaped biopsy marking clip was localized using a lateral to medial approach. RF tag function was confirmed. The images were marked for [place]. SITE 2: LEFT BREAST RETROAREOLAR HIGH-GRADE DUCTAL CARCINOMA  IN SITU/X SHAPED BIOPSY MARKING CLIP: Using mammographic guidance, sterile technique, 1% lidocaine and a 7 cm RF tag needle, the X shaped biopsy marking clip was localized using a lateral to medial approach. RF tag function was confirmed. The images were marked for [place]. IMPRESSION: Radiofrequency tag localization of the left breast, at site invasive ductal carcinoma/ribbon shaped biopsy marking clip and also at site of high-grade ductal carcinoma/X shaped biopsy marking clip. Electronically Signed   By: Everlean Alstrom M.D.   On: 12/12/2020 10:01     ASSESSMENT:  Stage I (PT1CPN0) left breast IDC: - Left breast 12:30 o'clock biopsy consistent with IDC, ER/PR positive, HER2 negative by FISH - Lumpectomy and SLNB on 12/13/2020, pathology with 1.3 cm low-grade, margins negative, ER/PR positive, HER2 negative, Ki-67 5%.  0/2 lymph nodes on SLNB.  2.  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 - Brother had prostate cancer.  Father had cancer, type unknown to the patient.    PLAN:  Stage I left breast IDC, ER/PR positive, HER2 negative: - We have reviewed pathology report in detail. - Recommend Oncotype DX.  Chemotherapy  only if high recurrence score. - She will require 5 years of antiestrogen therapy. - We will make a referral to radiation therapy. - RTC 2 weeks to discuss results and further plan.  2.  Bone health: - We will obtain a baseline bone density test.  We will also obtain vitamin D levels.  3.  Tobacco abuse: - She is a current active smoker, 1 pack/day for at least 40 years. - Talk to her about lung cancer screening with low-dose CT scans.  She is agreeable.  We will order a scan.     Derek Jack, MD 01/01/21 6:15 PM  [facility] [phone]   I, Thana Ates, am acting as a scribe for Dr. Derek Jack.  I, Derek Jack MD, have reviewed the above documentation for accuracy and completeness, and I agree with the above.

## 2021-01-01 ENCOUNTER — Other Ambulatory Visit: Payer: Self-pay

## 2021-01-01 ENCOUNTER — Inpatient Hospital Stay (HOSPITAL_COMMUNITY): Payer: BC Managed Care – PPO | Attending: Hematology | Admitting: Hematology

## 2021-01-01 ENCOUNTER — Encounter (HOSPITAL_COMMUNITY): Payer: Self-pay | Admitting: Hematology

## 2021-01-01 VITALS — BP 122/78 | HR 77 | Temp 97.0°F | Resp 18 | Ht 63.0 in | Wt 143.7 lb

## 2021-01-01 DIAGNOSIS — Z170 Estrogen receptor positive status [ER+]: Secondary | ICD-10-CM | POA: Insufficient documentation

## 2021-01-01 DIAGNOSIS — Z87891 Personal history of nicotine dependence: Secondary | ICD-10-CM

## 2021-01-01 DIAGNOSIS — C50912 Malignant neoplasm of unspecified site of left female breast: Secondary | ICD-10-CM

## 2021-01-01 DIAGNOSIS — C50412 Malignant neoplasm of upper-outer quadrant of left female breast: Secondary | ICD-10-CM | POA: Diagnosis not present

## 2021-01-01 DIAGNOSIS — Z122 Encounter for screening for malignant neoplasm of respiratory organs: Secondary | ICD-10-CM

## 2021-01-01 DIAGNOSIS — D0512 Intraductal carcinoma in situ of left breast: Secondary | ICD-10-CM

## 2021-01-01 NOTE — Telephone Encounter (Signed)
12/28/2020 OV Dr. Constance Haw increased patients time off for FMLA to 01/15/2021.   01/01/2021 patient had left a vm that the fax number for paperwork has changed and she needed it faxed to [phone].   Paperwork was updated with RTW 01/15/2021 and faxed to [phone] with confirmation.

## 2021-01-01 NOTE — Patient Instructions (Addendum)
[place] at [facility] Discharge Instructions  You were seen and examined today by Dr. Delton Coombes. Dr. Delton Coombes is a medical oncologist, meaning he specializes in the management of cancer diagnoses. Dr. Delton Coombes discussed your past medical history, family history of cancer and the events that led to you being here today.  You have been diagnosed with Stage I Breast Cancer. It is Estrogen and Progesterone Receptor positive, meaning it is fed by hormones that are occurs naturally within the body. Dr. Delton Coombes has recommended Oncotype DX. That is a test that is sent on the tumor tissue to see if you need any chemotherapy, if no chemotherapy is needed, you will need an antiestrogen pill for at least 5 years. Dr. Delton Coombes will also refer you for Radiation in [place]. Dr. Delton Coombes will also refer you for a Lung Cancer Screening CT scan and bone density.  Follow-up as scheduled.   Thank you for choosing [place] at [facility] to provide your oncology and hematology care.  To afford each patient quality time with our provider, please arrive at least 15 minutes before your scheduled appointment time.   If you have a lab appointment with the [facility] please come in thru the Main Entrance and check in at the main information desk.  You need to re-schedule your appointment should you arrive 10 or more minutes late.  We strive to give you quality time with our providers, and arriving late affects you and other patients whose appointments are after yours.  Also, if you no show three or more times for appointments you may be dismissed from the clinic at the providers discretion.     Again, thank you for choosing [facility].  Our hope is that these requests will decrease the amount of time that you wait before being seen by our physicians.       _____________________________________________________________  Should you have questions  after your visit to [facility], please contact our office at [phone] and follow the prompts.  Our office hours are 8:00 a.m. and 4:30 p.m. Monday - Friday.  Please note that voicemails left after 4:00 p.m. may not be returned until the following business day.  We are closed weekends and major holidays.  You do have access to a nurse 24-7, just call the main number to the clinic [phone] and do not press any options, hold on the line and a nurse will answer the phone.    For prescription refill requests, have your pharmacy contact our office and allow 72 hours.    Due to Covid, you will need to wear a mask upon entering the hospital. If you do not have a mask, a mask will be given to you at the Main Entrance upon arrival. For doctor visits, patients may have 1 support person age 75 or older with them. For treatment visits, patients can not have anyone with them due to social distancing guidelines and our immunocompromised population.

## 2021-01-02 ENCOUNTER — Encounter (HOSPITAL_COMMUNITY): Payer: Self-pay

## 2021-01-02 ENCOUNTER — Encounter (HOSPITAL_COMMUNITY): Payer: Self-pay | Admitting: Lab

## 2021-01-02 NOTE — Progress Notes (Signed)
I met with the patient during and after her initial visit with Dr. Delton Coombes. I introduced myself and explained my role in the patient's care. I provided my contact information and encouraged the patient to call with questions or concerns. I have communicated with RadOnc RN regarding referral placed. Oncotype DX ordered on Accession # [id number].

## 2021-01-02 NOTE — Progress Notes (Unsigned)
Referral sent to [facility].  Records faxed on 9/13

## 2021-01-03 ENCOUNTER — Encounter: Payer: Self-pay | Admitting: Family Medicine

## 2021-01-03 NOTE — Telephone Encounter (Signed)
Pt called and needed note sent to [facility] at [phone] to extended her out of work until 01/15/2021 for STD.  Note faxed and confirmation received.

## 2021-01-09 ENCOUNTER — Encounter (HOSPITAL_COMMUNITY): Payer: Self-pay

## 2021-01-10 ENCOUNTER — Other Ambulatory Visit: Payer: Self-pay

## 2021-01-10 ENCOUNTER — Ambulatory Visit (HOSPITAL_COMMUNITY)
Admission: RE | Admit: 2021-01-10 | Discharge: 2021-01-10 | Disposition: A | Discharge status: Home / Self Care | Payer: BC Managed Care – PPO | Source: Ambulatory Visit | Attending: Hematology | Admitting: Hematology

## 2021-01-10 DIAGNOSIS — D0512 Intraductal carcinoma in situ of left breast: Secondary | ICD-10-CM | POA: Diagnosis not present

## 2021-01-10 DIAGNOSIS — C50912 Malignant neoplasm of unspecified site of left female breast: Secondary | ICD-10-CM | POA: Insufficient documentation

## 2021-01-23 ENCOUNTER — Inpatient Hospital Stay (HOSPITAL_COMMUNITY): Payer: BC Managed Care – PPO | Admitting: Hematology

## 2021-01-25 ENCOUNTER — Other Ambulatory Visit: Payer: Self-pay

## 2021-01-25 ENCOUNTER — Ambulatory Visit (HOSPITAL_COMMUNITY)
Admission: RE | Admit: 2021-01-25 | Discharge: 2021-01-25 | Disposition: A | Discharge status: Home / Self Care | Payer: BC Managed Care – PPO | Source: Ambulatory Visit | Attending: Hematology | Admitting: Hematology

## 2021-01-25 DIAGNOSIS — Z122 Encounter for screening for malignant neoplasm of respiratory organs: Secondary | ICD-10-CM | POA: Diagnosis present

## 2021-01-25 DIAGNOSIS — Z87891 Personal history of nicotine dependence: Secondary | ICD-10-CM | POA: Diagnosis not present

## 2021-01-30 NOTE — Progress Notes (Signed)
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 female here today for follow up of her left breast cancer. Her last visit was on 01/01/2021.   Today she reports feeling good. She is agreeable to starting antiestrogen therapy with anastrozole.   REVIEW OF SYSTEMS:   Review of Systems  Constitutional:  Negative for appetite change (75%) and fatigue (75%).  Respiratory:  Positive for cough.   Psychiatric/Behavioral:  Positive for sleep disturbance.   All other systems reviewed and are negative.  I have reviewed the past medical history, past surgical history, social history and family history with the patient and they are unchanged from previous note.   ALLERGIES:   has No Known Allergies.   MEDICATIONS:  Current Outpatient Medications  Medication Sig Dispense Refill   albuterol (VENTOLIN HFA) 108 (90 Base) MCG/ACT inhaler 1 puff as needed for cough or shortness of breath     atorvastatin (LIPITOR) 20 MG tablet Take 20 mg by mouth daily.     buPROPion ER (WELLBUTRIN SR) 100 MG 12 hr tablet Take 100 mg by mouth daily.     glipiZIDE (GLUCOTROL) 10 MG tablet Take 10 mg by mouth daily before breakfast.     levothyroxine (SYNTHROID, LEVOTHROID) 100 MCG tablet Take 100 mcg by mouth daily before breakfast.  11   lisinopril (ZESTRIL) 5 MG tablet Take 5 mg by mouth daily.     metFORMIN (GLUCOPHAGE-XR) 500 MG 24 hr tablet Take 500 mg by mouth 2 (two) times daily.  1   oxyCODONE (ROXICODONE) 5 MG immediate release tablet Take 1  tablet (5 mg total) by mouth every 4 (four) hours as needed for severe pain or breakthrough pain. 10 tablet 0   Semaglutide,0.25 or 0.5MG /DOS, (OZEMPIC, 0.25 OR 0.5 MG/DOSE,) 2 MG/1.5ML SOPN Inject 0.5 mg into the skin once a week.     acetaminophen (TYLENOL) 500 MG tablet Take 1,000 mg by mouth every 6 (six) hours as needed for moderate pain or headache. (Patient not taking: Reported on 01/31/2021)     gabapentin (NEURONTIN) 100 MG capsule Take 100-200 mg by mouth 2 (two) times daily as needed (for cramping/pain.). (Patient not taking: Reported on 01/31/2021)     ibuprofen (ADVIL) 200 MG tablet Take 400 mg by mouth every 6 (six) hours as needed for headache or moderate pain. (Patient not taking: Reported on 01/31/2021)     melatonin 5 MG TABS Take 10-15 mg by mouth at bedtime as needed (sleep). (Patient not taking: Reported on 01/31/2021)     Current Facility-Administered Medications  Medication Dose Route Frequency Provider Last Rate Last Admin   influenza vac split quadrivalent PF (FLUARIX) injection 0.5 mL  0.5 mL Intramuscular Once Derek Jack, MD         PHYSICAL EXAMINATION: Performance status (ECOG): 0 - Asymptomatic  Vitals:   01/31/21 1548  BP: 140/72  Pulse: 93  Resp: 17  Temp: (!) 96.7 F (35.9 C)  SpO2: 100%   Wt Readings from Last 3 Encounters:  01/31/21 144 lb 9.6  oz (65.6 kg)  01/01/21 143 lb 11.2 oz (65.2 kg)  12/28/20 143 lb (64.9 kg)   Physical Exam Vitals reviewed.  Constitutional:      Appearance: Normal appearance.  Cardiovascular:     Rate and Rhythm: Normal rate and regular rhythm.     Pulses: Normal pulses.     Heart sounds: Normal heart sounds.  Pulmonary:     Effort: Pulmonary effort is normal.     Breath sounds: Normal breath sounds.  Neurological:     General: No focal deficit present.     Mental Status: She is alert and oriented to person, place, and time.  Psychiatric:        Mood and Affect: Mood normal.        Behavior: Behavior  normal.    Breast Exam Chaperone: Denise Sanders     LABORATORY DATA:  I have reviewed the data as listed CMP Latest Ref Rng & Units 12/12/2020 09/23/2017 09/18/2017  Glucose 70 - 99 mg/dL 57(L) 137(H) 199(H)  BUN 8 - 23 mg/dL $Remove'13 10 11  'dVClTQL$ Creatinine 0.44 - 1.00 mg/dL 0.74 0.75 0.79  Sodium 135 - 145 mmol/L 139 138 137  Potassium 3.5 - 5.1 mmol/L 4.1 4.2 3.7  Chloride 98 - 111 mmol/L 104 107 105  CO2 22 - 32 mmol/L 26 23 21(L)  Calcium 8.9 - 10.3 mg/dL 9.3 8.4(L) 9.0   No results found for: XQJ194 Lab Results  Component Value Date   WBC 9.3 09/23/2017   HGB 10.3 (L) 09/23/2017   HCT 30.5 (L) 09/23/2017   MCV 97.6 09/23/2017   PLT 194 09/23/2017    ASSESSMENT:  Stage I (PT1CPN0) left breast IDC: - Left breast 12:30 o'clock biopsy consistent with IDC, ER/PR positive, HER2 negative by FISH - Lumpectomy and SLNB on 12/13/2020, pathology with 1.3 cm low-grade, margins negative, ER/PR positive, HER2 negative, Ki-67 5%.  0/2 lymph nodes on SLNB. - Oncotype DX recurrence score 19.  Distant recurrence risk at 9 years with AI/tamoxifen alone was 6%.  Absolute chemotherapy benefit was less than 1%. - Anastrozole 1 mg daily started on 01/31/2021.  2.  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 - Brother had prostate cancer.  Father had cancer, type unknown to the patient.   PLAN:  Stage I left breast IDC, ER/PR positive, HER2 negative: - We have discussed Oncotype DX result and its implications. - She does not benefit from chemotherapy based on her recurrence score. - I have recommended antiestrogen therapy with anastrozole. - We discussed side effects including but not limited to hot flashes, musculoskeletal symptoms, decreased bone mineral density among others. - We have sent a prescription to her pharmacy. - She will proceed with radiation therapy starting tomorrow.  Dr. Lynnette Caffey is trying to get her to do genetic testing at [facility]  per patient. - We will see her back in 3 months to see how she is tolerating AI.  2.  Osteopenia: - Reviewed DEXA scan from 01/10/2021 with T score -1.4 consistent with osteopenia. - We will check vitamin D level at next visit. - Recommend starting calcium and vitamin D twice daily.  3.  Tobacco abuse: - We have reviewed results of chest CT lung cancer screening from 01/25/2021. - No suspicious nodules, lung RADS 2.  We will repeat another scan in 12 months.  Other findings including hiatal hernia, emphysema and coronary atherosclerosis were discussed.  Breast Cancer therapy associated  bone loss: I have recommended calcium, Vitamin D and weight bearing exercises.  Orders placed this encounter:  No orders of the defined types were placed in this encounter.   The patient has a good understanding of the overall plan. She agrees with it. She will call with any problems that may develop before the next visit here.  Derek Jack, MD [place] [phone]   I, Denise Sanders, am acting as a scribe for Dr. Derek Jack.  I, Derek Jack MD, have reviewed the above documentation for accuracy and completeness, and I agree with the above.

## 2021-01-31 ENCOUNTER — Other Ambulatory Visit: Payer: Self-pay

## 2021-01-31 ENCOUNTER — Inpatient Hospital Stay (HOSPITAL_COMMUNITY): Payer: BC Managed Care – PPO | Attending: Hematology | Admitting: Hematology

## 2021-01-31 VITALS — BP 140/72 | HR 93 | Temp 96.7°F | Resp 17 | Wt 144.6 lb

## 2021-01-31 DIAGNOSIS — C50412 Malignant neoplasm of upper-outer quadrant of left female breast: Secondary | ICD-10-CM | POA: Diagnosis not present

## 2021-01-31 DIAGNOSIS — D0512 Intraductal carcinoma in situ of left breast: Secondary | ICD-10-CM | POA: Diagnosis not present

## 2021-01-31 DIAGNOSIS — C50912 Malignant neoplasm of unspecified site of left female breast: Secondary | ICD-10-CM | POA: Diagnosis not present

## 2021-01-31 DIAGNOSIS — M8580 Other specified disorders of bone density and structure, unspecified site: Secondary | ICD-10-CM | POA: Insufficient documentation

## 2021-01-31 DIAGNOSIS — Z23 Encounter for immunization: Secondary | ICD-10-CM | POA: Diagnosis not present

## 2021-01-31 DIAGNOSIS — Z170 Estrogen receptor positive status [ER+]: Secondary | ICD-10-CM | POA: Diagnosis not present

## 2021-01-31 MED ORDER — ANASTROZOLE 1 MG PO TABS: 1.0000 mg | ORAL_TABLET | Freq: Every day | ORAL | 3 refills | Status: DC

## 2021-01-31 MED ORDER — INFLUENZA VAC SPLIT QUAD 0.5 ML IM SUSY
0.5000 mL | PREFILLED_SYRINGE | Freq: Once | INTRAMUSCULAR | Status: AC
  Administered 2021-01-31: 0.5 mL via INTRAMUSCULAR
  Filled 2021-01-31: qty 0.5

## 2021-01-31 NOTE — Patient Instructions (Addendum)
[place] at [facility] Discharge Instructions  You were seen and examined today by Dr. Delton Coombes. Your Oncotype testing shows that you will not require chemotherapy. Your cancer is receptive (responds) to estrogen so you will be started on an anti-estrogen pill Anastrozole. Start taking over the counter calcium and Vitamin D.  You will be on this pill for at least five years. Please follow up as scheduled.    Thank you for choosing [place] at [facility] to provide your oncology and hematology care.  To afford each patient quality time with our provider, please arrive at least 15 minutes before your scheduled appointment time.   If you have a lab appointment with the [place] please come in thru the Main Entrance and check in at the main information desk.  You need to re-schedule your appointment should you arrive 10 or more minutes late.  We strive to give you quality time with our providers, and arriving late affects you and other patients whose appointments are after yours.  Also, if you no show three or more times for appointments you may be dismissed from the clinic at the providers discretion.     Again, thank you for choosing [facility].  Our hope is that these requests will decrease the amount of time that you wait before being seen by our physicians.       _____________________________________________________________  Should you have questions after your visit to [facility], please contact our office at [phone] and follow the prompts.  Our office hours are 8:00 a.m. and 4:30 p.m. Monday - Friday.  Please note that voicemails left after 4:00 p.m. may not be returned until the following business day.  We are closed weekends and major holidays.  You do have access to a nurse 24-7, just call the main number to the clinic [phone] and do not press any options, hold on the line and a nurse will answer  the phone.    For prescription refill requests, have your pharmacy contact our office and allow 72 hours.    Due to Covid, you will need to wear a mask upon entering the hospital. If you do not have a mask, a mask will be given to you at the Main Entrance upon arrival. For doctor visits, patients may have 1 support person age 71 or older with them. For treatment visits, patients can not have anyone with them due to social distancing guidelines and our immunocompromised population.

## 2021-01-31 NOTE — Progress Notes (Signed)
Patient come in today for office visit. Flu vaccine ordered. See MAR for administration information. Patient stable throughout injection. Patient discharged ambulatory and in stable condition.

## 2021-05-02 ENCOUNTER — Inpatient Hospital Stay (HOSPITAL_COMMUNITY): Payer: BC Managed Care – PPO | Attending: Hematology

## 2021-05-02 ENCOUNTER — Other Ambulatory Visit: Payer: Self-pay

## 2021-05-02 DIAGNOSIS — D0512 Intraductal carcinoma in situ of left breast: Secondary | ICD-10-CM | POA: Diagnosis present

## 2021-05-02 DIAGNOSIS — M8580 Other specified disorders of bone density and structure, unspecified site: Secondary | ICD-10-CM | POA: Diagnosis not present

## 2021-05-02 DIAGNOSIS — Z79811 Long term (current) use of aromatase inhibitors: Secondary | ICD-10-CM | POA: Insufficient documentation

## 2021-05-02 DIAGNOSIS — C50912 Malignant neoplasm of unspecified site of left female breast: Secondary | ICD-10-CM

## 2021-05-02 LAB — VITAMIN D 25 HYDROXY (VIT D DEFICIENCY, FRACTURES): Vit D, 25-Hydroxy: 22.64 ng/mL — ABNORMAL LOW (ref 30–100)

## 2021-05-02 LAB — CBC WITH DIFFERENTIAL/PLATELET
Abs Immature Granulocytes: 0.01 10*3/uL (ref 0.00–0.07)
Basophils Absolute: 0 10*3/uL (ref 0.0–0.1)
Basophils Relative: 0 %
Eosinophils Absolute: 0.1 10*3/uL (ref 0.0–0.5)
Eosinophils Relative: 1 %
HCT: 34.8 % — ABNORMAL LOW (ref 36.0–46.0)
Hemoglobin: 11.6 g/dL — ABNORMAL LOW (ref 12.0–15.0)
Immature Granulocytes: 0 %
Lymphocytes Relative: 15 %
Lymphs Abs: 0.8 10*3/uL (ref 0.7–4.0)
MCH: 33.1 pg (ref 26.0–34.0)
MCHC: 33.3 g/dL (ref 30.0–36.0)
MCV: 99.4 fL (ref 80.0–100.0)
Monocytes Absolute: 0.4 10*3/uL (ref 0.1–1.0)
Monocytes Relative: 8 %
Neutro Abs: 3.8 10*3/uL (ref 1.7–7.7)
Neutrophils Relative %: 76 %
Platelets: 193 10*3/uL (ref 150–400)
RBC: 3.5 MIL/uL — ABNORMAL LOW (ref 3.87–5.11)
RDW: 12.6 % (ref 11.5–15.5)
WBC: 5 10*3/uL (ref 4.0–10.5)
nRBC: 0 % (ref 0.0–0.2)

## 2021-05-02 LAB — COMPREHENSIVE METABOLIC PANEL
ALT: 19 U/L (ref 0–44)
AST: 18 U/L (ref 15–41)
Albumin: 3.8 g/dL (ref 3.5–5.0)
Alkaline Phosphatase: 52 U/L (ref 38–126)
Anion gap: 7 (ref 5–15)
BUN: 17 mg/dL (ref 8–23)
CO2: 27 mmol/L (ref 22–32)
Calcium: 9.1 mg/dL (ref 8.9–10.3)
Chloride: 104 mmol/L (ref 98–111)
Creatinine, Ser: 0.81 mg/dL (ref 0.44–1.00)
GFR, Estimated: >60 mL/min (ref >60)
Glucose, Bld: 162 mg/dL — ABNORMAL HIGH (ref 70–99)
Potassium: 3.9 mmol/L (ref 3.5–5.1)
Sodium: 138 mmol/L (ref 135–145)
Total Bilirubin: 0.3 mg/dL (ref 0.3–1.2)
Total Protein: 6.5 g/dL (ref 6.5–8.1)

## 2021-05-09 ENCOUNTER — Other Ambulatory Visit: Payer: Self-pay

## 2021-05-09 ENCOUNTER — Inpatient Hospital Stay (HOSPITAL_COMMUNITY): Payer: BC Managed Care – PPO | Admitting: Hematology

## 2021-05-09 VITALS — BP 116/82 | HR 85 | Temp 98.6°F | Resp 17 | Ht 63.0 in | Wt 144.9 lb

## 2021-05-09 DIAGNOSIS — D0512 Intraductal carcinoma in situ of left breast: Secondary | ICD-10-CM

## 2021-05-09 DIAGNOSIS — C50912 Malignant neoplasm of unspecified site of left female breast: Secondary | ICD-10-CM

## 2021-05-09 NOTE — Progress Notes (Signed)
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 female here today for follow up of her left breast cancer. Her last visit was on 01/31/2021.   Today she reports feeling good. She is taking anastrozole and tolerating it well. She reports occasional evening hot flashes, and she denies joint pains. She is taking Calcium + Vitamin D.   REVIEW OF SYSTEMS:   Review of Systems  Constitutional:  Negative for appetite change and fatigue.  Respiratory:  Positive for shortness of breath.   Endocrine: Positive for hot flashes (occasional).  Musculoskeletal:  Negative for arthralgias.  Psychiatric/Behavioral:  Positive for sleep disturbance.   All other systems reviewed and are negative.  I have reviewed the past medical history, past surgical history, social history and family history with the patient and they are unchanged from previous note.   ALLERGIES:   has No Known Allergies.   MEDICATIONS:  Current Outpatient Medications  Medication Sig Dispense Refill   albuterol (VENTOLIN HFA) 108 (90 Base) MCG/ACT inhaler 1 puff as needed for cough or shortness of breath     anastrozole (ARIMIDEX) 1 MG tablet Take 1 tablet (1 mg total) by mouth daily. 90 tablet 3   atorvastatin (LIPITOR) 20 MG tablet Take 20 mg by mouth daily.     buPROPion ER (WELLBUTRIN SR) 100 MG 12 hr tablet Take 100 mg by mouth daily.     diclofenac (VOLTAREN) 75 MG EC tablet Take 75 mg by mouth 2 (two) times daily.     glipiZIDE (GLUCOTROL) 5 MG tablet TAKE 1  TABLET BY MOUTH EVERY DAY 30 MINUTES BEFORE BREAKFAST     levothyroxine (SYNTHROID, LEVOTHROID) 100 MCG tablet Take 100 mcg by mouth daily before breakfast.  11   lisinopril (ZESTRIL) 5 MG tablet Take 5 mg by mouth daily.     metFORMIN (GLUCOPHAGE-XR) 500 MG 24 hr tablet Take 500 mg by mouth 2 (two) times daily.  1   Semaglutide,0.25 or 0.5MG/DOS, (OZEMPIC, 0.25 OR 0.5 MG/DOSE,) 2 MG/1.5ML SOPN Inject 0.5 mg into the skin once a week.     acetaminophen (TYLENOL) 500 MG tablet Take 1,000 mg by mouth every 6 (six) hours as needed for moderate pain or headache. (Patient not taking: Reported on 05/09/2021)     gabapentin (NEURONTIN) 100 MG capsule Take 100-200 mg by mouth 2 (two) times daily as needed (for cramping/pain.). (Patient not taking: Reported on 05/09/2021)     ibuprofen (ADVIL) 200 MG tablet Take 400 mg by mouth every 6 (six) hours as needed for headache or moderate pain. (Patient not taking: Reported on 05/09/2021)     melatonin 5 MG TABS Take 10-15 mg by mouth at bedtime as needed (sleep). (Patient not taking: Reported on 05/09/2021)     oxyCODONE (ROXICODONE) 5 MG immediate release tablet Take 1 tablet (5 mg total) by mouth every 4 (four) hours as needed for severe pain or breakthrough pain. (Patient not taking: Reported on 05/09/2021) 10 tablet 0   No current facility-administered medications for this visit.  PHYSICAL EXAMINATION: Performance status (ECOG): 0 - Asymptomatic  Vitals:   05/09/21 1517  BP: 116/82  Pulse: 85  Resp: 17  Temp: 98.6 F (37 C)  SpO2: 99%   Wt Readings from Last 3 Encounters:  05/09/21 144 lb 14.4 oz (65.7 kg)  01/31/21 144 lb 9.6 oz (65.6 kg)  01/01/21 143 lb 11.2 oz (65.2 kg)   Physical Exam Vitals reviewed.  Constitutional:      Appearance: Normal appearance.  Cardiovascular:     Rate and Rhythm: Normal rate and regular rhythm.     Pulses: Normal pulses.     Heart sounds: Normal heart sounds.  Pulmonary:     Effort: Pulmonary effort is  normal.     Breath sounds: Normal breath sounds.  Chest:  Breasts:    Right: Normal. No swelling, bleeding, inverted nipple, mass, nipple discharge, skin change or tenderness.     Left: Swelling (lymphedema) present. No bleeding, inverted nipple, mass, nipple discharge, skin change (lumpectomy scar around areola) or tenderness.  Neurological:     General: No focal deficit present.     Mental Status: She is alert and oriented to person, place, and time.  Psychiatric:        Mood and Affect: Mood normal.        Behavior: Behavior normal.    Breast Exam Chaperone: Denise Sanders     LABORATORY DATA:  I have reviewed the data as listed CMP Latest Ref Rng & Units 05/02/2021 12/12/2020 09/23/2017  Glucose 70 - 99 mg/dL 162(H) 57(L) 137(H)  BUN 8 - 23 mg/dL 17 13 10   Creatinine 0.44 - 1.00 mg/dL 0.81 0.74 0.75  Sodium 135 - 145 mmol/L 138 139 138  Potassium 3.5 - 5.1 mmol/L 3.9 4.1 4.2  Chloride 98 - 111 mmol/L 104 104 107  CO2 22 - 32 mmol/L 27 26 23   Calcium 8.9 - 10.3 mg/dL 9.1 9.3 8.4(L)  Total Protein 6.5 - 8.1 g/dL 6.5 - -  Total Bilirubin 0.3 - 1.2 mg/dL 0.3 - -  Alkaline Phos 38 - 126 U/L 52 - -  AST 15 - 41 U/L 18 - -  ALT 0 - 44 U/L 19 - -   No results found for: HFS142 Lab Results  Component Value Date   WBC 5.0 05/02/2021   HGB 11.6 (L) 05/02/2021   HCT 34.8 (L) 05/02/2021   MCV 99.4 05/02/2021   PLT 193 05/02/2021   NEUTROABS 3.8 05/02/2021    ASSESSMENT:  Stage I (PT1CPN0) left breast IDC: - Left breast 12:30 o'clock biopsy consistent with IDC, ER/PR positive, HER2 negative by FISH - Lumpectomy and SLNB on 12/13/2020, pathology with 1.3 cm low-grade, margins negative, ER/PR positive, HER2 negative, Ki-67 5%.  0/2 lymph nodes on SLNB. - Oncotype DX recurrence score 19.  Distant recurrence risk at 9 years with AI/tamoxifen alone was 6%.  Absolute chemotherapy benefit was less than 1%. - Anastrozole 1 mg daily started on 01/31/2021. - XRT to left breast from  02/01/2021 through 03/05/2021.  2.  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 - Brother had prostate cancer.  Father had cancer, type unknown to the patient.   PLAN:  Stage I left breast IDC, ER/PR positive, HER2 negative: - Anastrozole was started on 01/31/2021. - XRT to the left breast completed on 03/05/2021. - Physical examination today shows lumpectomy scar around the left areola within normal limits with no palpable masses in  bilateral breast. - We reviewed labs which showed normal LFTs and CBC. - We will schedule her for mammogram after 10/12/2021.  RTC 6 months for follow-up.  2.  Osteopenia: - DEXA scan on 01/10/2021 with T score -1.4 consistent with osteopenia. - Vitamin D level is low at 22.  She is taking 1 tablet of calcium and D. - Recommend increasing the dose of calcium and vitamin D.  3.  Tobacco abuse: - CT lung cancer screening on 01/25/2021 was lung RADS 2. - Plan to repeat in October this year.  Breast Cancer therapy associated bone loss: I have recommended calcium, Vitamin D and weight bearing exercises.  Orders placed this encounter:  No orders of the defined types were placed in this encounter.   The patient has a good understanding of the overall plan. She agrees with it. She will call with any problems that may develop before the next visit here.  Derek Jack, MD [place] [phone]   I, Denise Sanders, am acting as a scribe for Dr. Derek Jack.  I, Derek Jack MD, have reviewed the above documentation for accuracy and completeness, and I agree with the above.

## 2021-05-09 NOTE — Patient Instructions (Addendum)
[place] at [facility] Discharge Instructions  You were seen and examined today by Dr. Delton Coombes. He reviewed your most recent labs and mammogram. Continue taking Anastrozole increase over the counter Calcium and Vitamin D to twice a day or get bigger dose Calcium 1200 and Vitamin D 2000 iu. Repeat mammogram in 09/2021. Please keep follow up appointment as scheduled in 6 months.   Thank you for choosing [place] at [facility] to provide your oncology and hematology care.  To afford each patient quality time with our provider, please arrive at least 15 minutes before your scheduled appointment time.   If you have a lab appointment with the [place] please come in thru the Main Entrance and check in at the main information desk.  You need to re-schedule your appointment should you arrive 10 or more minutes late.  We strive to give you quality time with our providers, and arriving late affects you and other patients whose appointments are after yours.  Also, if you no show three or more times for appointments you may be dismissed from the clinic at the providers discretion.     Again, thank you for choosing [facility].  Our hope is that these requests will decrease the amount of time that you wait before being seen by our physicians.       _____________________________________________________________  Should you have questions after your visit to [facility], please contact our office at [phone] and follow the prompts.  Our office hours are 8:00 a.m. and 4:30 p.m. Monday - Friday.  Please note that voicemails left after 4:00 p.m. may not be returned until the following business day.  We are closed weekends and major holidays.  You do have access to a nurse 24-7, just call the main number to the clinic [phone] and do not press any options, hold on the line and a nurse will answer the phone.    For prescription  refill requests, have your pharmacy contact our office and allow 72 hours.    Due to Covid, you will need to wear a mask upon entering the hospital. If you do not have a mask, a mask will be given to you at the Main Entrance upon arrival. For doctor visits, patients may have 1 support person age 80 or older with them. For treatment visits, patients can not have anyone with them due to social distancing guidelines and our immunocompromised population.

## 2021-06-23 ENCOUNTER — Encounter: Payer: Self-pay | Admitting: Emergency Medicine

## 2021-06-23 ENCOUNTER — Ambulatory Visit
Admission: EM | Admit: 2021-06-23 | Discharge: 2021-06-23 | Disposition: A | Discharge status: Home / Self Care | Payer: BC Managed Care – PPO | Attending: Physician Assistant | Admitting: Physician Assistant

## 2021-06-23 ENCOUNTER — Other Ambulatory Visit: Payer: Self-pay

## 2021-06-23 DIAGNOSIS — K219 Gastro-esophageal reflux disease without esophagitis: Secondary | ICD-10-CM

## 2021-06-23 DIAGNOSIS — J029 Acute pharyngitis, unspecified: Secondary | ICD-10-CM

## 2021-06-23 LAB — GROUP A STREP BY PCR: Group A Strep by PCR: NOT DETECTED

## 2021-06-23 MED ORDER — PANTOPRAZOLE SODIUM 20 MG PO TBEC: 20.0000 mg | DELAYED_RELEASE_TABLET | Freq: Every day | ORAL | 1 refills | Status: DC

## 2021-06-23 MED ORDER — SUCRALFATE 1 GM/10ML PO SUSP
1.0000 g | Freq: Three times a day (TID) | ORAL | 0 refills | Status: DC
Start: 2021-06-23 — End: 2023-06-16

## 2021-06-23 MED ORDER — LIDOCAINE VISCOUS HCL 2 % MT SOLN: 15.0000 mL | OROMUCOSAL | 0 refills | Status: DC | PRN

## 2021-06-23 NOTE — Discharge Instructions (Addendum)
-  I will call you with the result of your strep test if it is positive.  We will send antibiotics if positive.  If not hear from you is negative and your symptoms are likely due to reflux and after the vomiting.  In that case acid reflux medication to the pharmacy.  I have already sent viscous lidocaine to help numb your throat either way. ?- Increase rest and fluids and avoid trigger foods including alcohol. ?

## 2021-06-23 NOTE — ED Triage Notes (Signed)
Pt c/o sore throat. She states she went to a concert and had bad acid reflux and vomited. She states her throat has been very sore ever since. Throat did not hurt at all before she vomited.  ?

## 2021-06-23 NOTE — ED Provider Notes (Signed)
[facility]    CSN: [id number] Arrival date & time: 06/23/21  1138      History   Chief Complaint Chief Complaint  Patient presents with   Sore Throat   Gastroesophageal Reflux    HPI Denise Sanders is a 66 y.o. female presenting for severe sore throat since yesterday.  Reports that she went to a concert in [place] for the night and drink "a lot of beer."  She states that she then threw up multiple times after that.  Has not had any vomiting today.  She says the sore throat started right after the vomiting.  Denies associated fever, fatigue, cough, congestion, breathing trouble.  Believes it may be due to her acid reflux.  States she occasionally takes something for acid reflux but has not been taking anything recently and has not taken anything since her throat became sore.  Denies associated abdominal pain.  No other concerns.  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   Postoperative state 09/22/2017    Past Surgical History:  Procedure Laterality Date   ABDOMINAL HYSTERECTOMY     ANTERIOR AND POSTERIOR REPAIR N/A 09/22/2017   Procedure: ANTERIOR (CYSTOCELE) AND POSTERIOR REPAIR (RECTOCELE);  Surgeon: Schermerhorn, Gwen Her, MD;  Location: [facility];  Service: Gynecology;  Laterality: N/A;   APPENDECTOMY     BILATERAL SALPINGECTOMY Bilateral 09/22/2017   Procedure: BILATERAL SALPINGECTOMY;  Surgeon: Schermerhorn, Gwen Her, MD;  Location: [facility];  Service: Gynecology;  Laterality: Bilateral;   BREAST LUMPECTOMY WITH RADIOFREQUENCY TAG IDENTIFICATION Left 0/86/7619   Procedure: BREAST LUMPECTOMY WITH RADIOFREQUENCY TAG IDENTIFICATION;  Surgeon: Virl Cagey, MD;  Location: [facility];  Service: General;  Laterality: Left;   CATARACT EXTRACTION W/PHACO Right 08/25/2020   Procedure: CATARACT EXTRACTION PHACO AND INTRAOCULAR LENS PLACEMENT RIGHT EYE;  Surgeon: Baruch Goldmann, MD;  Location: [facility];  Service: Ophthalmology;  Laterality: Right;  CDE  7.01   CATARACT EXTRACTION W/PHACO Left 10/20/2020   Procedure: CATARACT EXTRACTION PHACO AND INTRAOCULAR LENS PLACEMENT (IOC);  Surgeon: Baruch Goldmann, MD;  Location: [facility];  Service: Ophthalmology;  Laterality: Left;  CDE: 7.61   COLONOSCOPY WITH PROPOFOL N/A 12/16/2017   Procedure: COLONOSCOPY WITH PROPOFOL;  Surgeon: Toledo, Benay Pike, MD;  Location: [facility];  Service: Gastroenterology;  Laterality: N/A;  (+) DM - oral   PARTIAL MASTECTOMY WITH AXILLARY SENTINEL LYMPH NODE BIOPSY Left 12/13/2020   Procedure: PARTIAL MASTECTOMY WITH AXILLARY SENTINEL LYMPH NODE BIOPSY;  Surgeon: Virl Cagey, MD;  Location: [facility];  Service: General;  Laterality: Left;   TRACHEOSTOMY  2018   VAGINAL HYSTERECTOMY N/A 09/22/2017   Procedure: HYSTERECTOMY VAGINAL;  Surgeon: Boykin Nearing, MD;  Location: [facility];  Service: Gynecology;  Laterality: N/A;    OB History   No obstetric history on file.      Home Medications    Prior to Admission medications   Medication Sig Start Date End Date Taking? Authorizing Provider  acetaminophen (TYLENOL) 500 MG tablet Take 1,000 mg by mouth every 6 (six) hours as needed for moderate pain or headache.   Yes [provider]  albuterol (VENTOLIN HFA)  108 (90 Base) MCG/ACT inhaler 1 puff as needed for cough or shortness of breath 09/14/20  Yes [provider]  anastrozole (ARIMIDEX) 1 MG tablet Take 1 tablet (1 mg total) by mouth daily. 01/31/21  Yes Derek Jack, MD  atorvastatin (LIPITOR) 20 MG tablet Take 20 mg by mouth daily.   Yes [provider]  buPROPion ER (WELLBUTRIN SR) 100 MG 12 hr tablet Take 100 mg by mouth daily.  11/19/20  Yes [provider]  gabapentin (NEURONTIN) 100 MG capsule Take 100-200 mg by mouth 2 (two) times daily as needed (for cramping/pain.).   Yes [provider]  glipiZIDE (GLUCOTROL) 5 MG tablet TAKE 1 TABLET BY MOUTH EVERY DAY 30 MINUTES BEFORE BREAKFAST   Yes [provider]  levothyroxine (SYNTHROID, LEVOTHROID) 100 MCG tablet Take 100 mcg by mouth daily before breakfast. 08/21/17  Yes [provider]  lidocaine (XYLOCAINE) 2 % solution Use as directed 15 mLs in the mouth or throat every 3 (three) hours as needed for mouth pain (swish and spit). 06/23/21  Yes Laurene Footman B, PA-C  lisinopril (ZESTRIL) 5 MG tablet Take 5 mg by mouth daily. 10/23/20  Yes [provider]  melatonin 5 MG TABS Take 10-15 mg by mouth at bedtime as needed (sleep).   Yes [provider]  metFORMIN (GLUCOPHAGE-XR) 500 MG 24 hr tablet Take 500 mg by mouth 2 (two) times daily. 08/04/17  Yes [provider]  pantoprazole (PROTONIX) 20 MG tablet Take 1 tablet (20 mg total) by mouth daily for 15 days. 06/23/21 07/08/21 Yes Laurene Footman B, PA-C  Semaglutide,0.25 or 0.'5MG'$ /DOS, (OZEMPIC, 0.25 OR 0.5 MG/DOSE,) 2 MG/1.5ML SOPN Inject 0.5 mg into the skin once a week.   Yes [provider]  sucralfate (CARAFATE) 1 GM/10ML suspension Take 10 mLs (1 g total) by mouth 4 (four) times daily -  with meals and at bedtime. 06/23/21  Yes Laurene Footman B, PA-C  ibuprofen (ADVIL) 200 MG tablet Take 400 mg by mouth every 6 (six) hours as needed for headache or moderate pain. Patient not taking: Reported on 05/09/2021    [provider]  oxyCODONE (ROXICODONE) 5 MG immediate release tablet Take 1 tablet (5 mg total) by mouth every 4 (four) hours as needed for severe pain or breakthrough pain. Patient not taking: Reported on 05/09/2021 12/28/20 12/28/21  Virl Cagey, MD    Family History History reviewed. No pertinent family history.  Social History Social History    Tobacco Use   Smoking status: Every Day    Packs/day: 0.50    Years: 39.00    Pack years: 19.50    Types: Cigarettes   Smokeless tobacco: Never  Vaping Use   Vaping Use: Former  Substance Use Topics   Alcohol use: Yes    Alcohol/week: 7.0 standard drinks    Types: 7 Glasses of wine per week   Drug use: No     Allergies   Patient has no known allergies.   Review of Systems Review of Systems  Constitutional:  Negative for chills, diaphoresis, fatigue and fever.  HENT:  Positive for sore throat. Negative for congestion, rhinorrhea, trouble swallowing and voice change.   Respiratory:  Negative for cough and shortness of breath.   Cardiovascular:  Negative for chest pain.  Gastrointestinal:  Positive for vomiting. Negative for abdominal pain and nausea.  Musculoskeletal:  Negative for arthralgias and myalgias.  Skin:  Negative for rash.  Neurological:  Negative for weakness and headaches.  Physical Exam Triage Vital Signs ED Triage Vitals  Enc Vitals Group     BP 06/23/21 1228 134/90     Pulse Rate 06/23/21 1228 79     Resp 06/23/21 1228 18     Temp 06/23/21 1228 98.5 F (36.9 C)     Temp Source 06/23/21 1228 Oral     SpO2 06/23/21 1228 100 %     Weight 06/23/21 1225 144 lb 13.5 oz (65.7 kg)     Height 06/23/21 1225 '5\' 3"'$  (1.6 m)     Head Circumference --      Peak Flow --      Pain Score 06/23/21 1225 8     Pain Loc --      Pain Edu? --      Excl. in [place]? --    No data found.  Updated Vital Signs BP 134/90 (BP Location: Right Arm)    Pulse 79    Temp 98.5 F (36.9 C) (Oral)    Resp 18    Ht '5\' 3"'$  (1.6 m)    Wt 144 lb 13.5 oz (65.7 kg)    SpO2 100%    BMI 25.66 kg/m      Physical Exam Vitals and nursing note reviewed.  Constitutional:      General: She is not in acute distress.    Appearance: Normal appearance. She is not ill-appearing or toxic-appearing.  HENT:     Head: Normocephalic and atraumatic.     Nose: Nose normal.     Mouth/Throat:      Mouth: Mucous membranes are moist.     Pharynx: Oropharynx is clear. Posterior oropharyngeal erythema present.  Eyes:     General: No scleral icterus.       Right eye: No discharge.        Left eye: No discharge.     Conjunctiva/sclera: Conjunctivae normal.  Cardiovascular:     Rate and Rhythm: Normal rate and regular rhythm.     Heart sounds: Normal heart sounds.  Pulmonary:     Effort: Pulmonary effort is normal. No respiratory distress.     Breath sounds: Normal breath sounds.  Musculoskeletal:     Cervical back: Neck supple.  Skin:    General: Skin is dry.  Neurological:     General: No focal deficit present.     Mental Status: She is alert. Mental status is at baseline.     Motor: No weakness.     Gait: Gait normal.  Psychiatric:        Mood and Affect: Mood normal.        Behavior: Behavior normal.        Thought Content: Thought content normal.     UC Treatments / Results  Labs (all labs ordered are listed, but only abnormal results are displayed) Labs Reviewed  GROUP A STREP BY PCR    EKG   Radiology No results found.  Procedures Procedures (including critical care time)  Medications Ordered in UC Medications - No data to display  Initial Impression / Assessment and Plan / UC Course  I have reviewed the triage vital signs and the nursing notes.  Pertinent labs & imaging results that were available during my care of the patient were reviewed by me and considered in my medical decision making (see chart for details).   65 year old female presenting for sore throat after vomiting secondary to excessive alcohol intake.  She is denying any associated abdominal pain or continued vomiting but continues  to have a sore throat.  PCR strep negative.   Suspect patient sore throat is secondary to GERD.  Advised her to avoid alcohol intake, especially excessive alcohol intake.  Also avoid other triggers.  Sent viscous lidocaine, pantoprazole and Carafate to  pharmacy.  Encouraged increasing rest and fluids.  Follow-up as needed.   Final Clinical Impressions(s) / UC Diagnoses   Final diagnoses:  Sore throat  Gastroesophageal reflux disease, unspecified whether esophagitis present     Discharge Instructions      -I will call you with the result of your strep test if it is positive.  We will send antibiotics if positive.  If not hear from you is negative and your symptoms are likely due to reflux and after the vomiting.  In that case acid reflux medication to the pharmacy.  I have already sent viscous lidocaine to help numb your throat either way. - Increase rest and fluids and avoid trigger foods including alcohol.     ED Prescriptions     Medication Sig Dispense Auth. Provider   lidocaine (XYLOCAINE) 2 % solution Use as directed 15 mLs in the mouth or throat every 3 (three) hours as needed for mouth pain (swish and spit). 100 mL Laurene Footman B, PA-C   pantoprazole (PROTONIX) 20 MG tablet Take 1 tablet (20 mg total) by mouth daily for 15 days. 15 tablet Laurene Footman B, PA-C   sucralfate (CARAFATE) 1 GM/10ML suspension Take 10 mLs (1 g total) by mouth 4 (four) times daily -  with meals and at bedtime. 420 mL Danton Clap, PA-C      PDMP not reviewed this encounter.   Danton Clap, PA-C 06/23/21 1401

## 2021-08-01 ENCOUNTER — Encounter: Payer: Self-pay | Admitting: *Deleted

## 2021-09-26 ENCOUNTER — Encounter: Payer: Self-pay | Admitting: *Deleted

## 2021-09-26 ENCOUNTER — Other Ambulatory Visit (HOSPITAL_COMMUNITY): Payer: Self-pay | Admitting: Hematology

## 2021-09-26 DIAGNOSIS — Z9889 Other specified postprocedural states: Secondary | ICD-10-CM

## 2021-10-03 ENCOUNTER — Ambulatory Visit: Payer: BC Managed Care – PPO

## 2021-10-15 ENCOUNTER — Ambulatory Visit (HOSPITAL_COMMUNITY): Payer: BC Managed Care – PPO

## 2021-10-16 ENCOUNTER — Ambulatory Visit (HOSPITAL_COMMUNITY)
Admission: RE | Admit: 2021-10-16 | Discharge: 2021-10-16 | Disposition: A | Discharge status: Home / Self Care | Payer: BC Managed Care – PPO | Source: Ambulatory Visit | Attending: Hematology | Admitting: Hematology

## 2021-10-16 DIAGNOSIS — Z9889 Other specified postprocedural states: Secondary | ICD-10-CM | POA: Diagnosis present

## 2021-11-06 ENCOUNTER — Inpatient Hospital Stay (HOSPITAL_COMMUNITY): Payer: BC Managed Care – PPO

## 2021-11-13 ENCOUNTER — Ambulatory Visit (HOSPITAL_COMMUNITY): Payer: BC Managed Care – PPO | Admitting: Hematology

## 2021-11-22 ENCOUNTER — Inpatient Hospital Stay: Payer: BC Managed Care – PPO | Attending: Hematology

## 2021-11-22 DIAGNOSIS — Z79811 Long term (current) use of aromatase inhibitors: Secondary | ICD-10-CM | POA: Insufficient documentation

## 2021-11-22 DIAGNOSIS — Z170 Estrogen receptor positive status [ER+]: Secondary | ICD-10-CM | POA: Diagnosis not present

## 2021-11-22 DIAGNOSIS — C50412 Malignant neoplasm of upper-outer quadrant of left female breast: Secondary | ICD-10-CM | POA: Diagnosis present

## 2021-11-22 DIAGNOSIS — M8580 Other specified disorders of bone density and structure, unspecified site: Secondary | ICD-10-CM | POA: Insufficient documentation

## 2021-11-22 DIAGNOSIS — C50912 Malignant neoplasm of unspecified site of left female breast: Secondary | ICD-10-CM

## 2021-11-22 DIAGNOSIS — D0512 Intraductal carcinoma in situ of left breast: Secondary | ICD-10-CM

## 2021-11-22 LAB — VITAMIN D 25 HYDROXY (VIT D DEFICIENCY, FRACTURES): Vit D, 25-Hydroxy: 29.03 ng/mL — ABNORMAL LOW (ref 30–100)

## 2021-11-22 LAB — COMPREHENSIVE METABOLIC PANEL
ALT: 12 U/L (ref 0–44)
AST: 13 U/L — ABNORMAL LOW (ref 15–41)
Albumin: 4 g/dL (ref 3.5–5.0)
Alkaline Phosphatase: 51 U/L (ref 38–126)
Anion gap: 6 (ref 5–15)
BUN: 15 mg/dL (ref 8–23)
CO2: 26 mmol/L (ref 22–32)
Calcium: 9.3 mg/dL (ref 8.9–10.3)
Chloride: 107 mmol/L (ref 98–111)
Creatinine, Ser: 0.72 mg/dL (ref 0.44–1.00)
GFR, Estimated: >60 mL/min (ref >60)
Glucose, Bld: 98 mg/dL (ref 70–99)
Potassium: 4 mmol/L (ref 3.5–5.1)
Sodium: 139 mmol/L (ref 135–145)
Total Bilirubin: 0.6 mg/dL (ref 0.3–1.2)
Total Protein: 7.3 g/dL (ref 6.5–8.1)

## 2021-11-22 LAB — CBC WITH DIFFERENTIAL/PLATELET
Abs Immature Granulocytes: 0.02 10*3/uL (ref 0.00–0.07)
Basophils Absolute: 0 10*3/uL (ref 0.0–0.1)
Basophils Relative: 1 %
Eosinophils Absolute: 0.1 10*3/uL (ref 0.0–0.5)
Eosinophils Relative: 2 %
HCT: 38.4 % (ref 36.0–46.0)
Hemoglobin: 12.6 g/dL (ref 12.0–15.0)
Immature Granulocytes: 0 %
Lymphocytes Relative: 28 %
Lymphs Abs: 1.7 10*3/uL (ref 0.7–4.0)
MCH: 32.6 pg (ref 26.0–34.0)
MCHC: 32.8 g/dL (ref 30.0–36.0)
MCV: 99.5 fL (ref 80.0–100.0)
Monocytes Absolute: 0.4 10*3/uL (ref 0.1–1.0)
Monocytes Relative: 7 %
Neutro Abs: 3.8 10*3/uL (ref 1.7–7.7)
Neutrophils Relative %: 62 %
Platelets: 219 10*3/uL (ref 150–400)
RBC: 3.86 MIL/uL — ABNORMAL LOW (ref 3.87–5.11)
RDW: 12.7 % (ref 11.5–15.5)
WBC: 6.2 10*3/uL (ref 4.0–10.5)
nRBC: 0 % (ref 0.0–0.2)

## 2021-11-29 ENCOUNTER — Inpatient Hospital Stay: Payer: BC Managed Care – PPO | Admitting: Hematology

## 2021-11-29 ENCOUNTER — Other Ambulatory Visit: Payer: Self-pay | Admitting: Hematology

## 2021-11-29 VITALS — BP 143/82 | HR 87 | Temp 98.5°F | Resp 18 | Ht 63.0 in | Wt 136.0 lb

## 2021-11-29 DIAGNOSIS — C50912 Malignant neoplasm of unspecified site of left female breast: Secondary | ICD-10-CM

## 2021-11-29 DIAGNOSIS — C50412 Malignant neoplasm of upper-outer quadrant of left female breast: Secondary | ICD-10-CM | POA: Diagnosis not present

## 2021-11-29 NOTE — Progress Notes (Signed)
[facility] [address] [place] [address]   Patient Care Team: Leonie Douglas, MD as PCP - General (Family Medicine) Brien Mates, RN as Oncology Nurse Navigator (Oncology) Derek Jack, MD as Medical Oncologist (Oncology)  SUMMARY OF ONCOLOGIC HISTORY: Oncology History  Invasive ductal carcinoma of left breast ([place])  01/09/2021 Genetic Testing   Oncotype DX:        CHIEF COMPLIANT: Follow-up of left breast cancer   INTERVAL HISTORY: Ms. Denise Sanders is a 66 y.o. female seen for follow-up of left breast cancer.  She is tolerating anastrozole very well.  Denies any major hot flashes.  Chronic cough is stable.  REVIEW OF SYSTEMS:   Review of Systems  Constitutional:  Negative for appetite change and fatigue.  Endocrine: Negative for hot flashes.  Musculoskeletal:  Negative for arthralgias.  Psychiatric/Behavioral:  Negative for sleep disturbance.   All other systems reviewed and are negative.   I have reviewed the past medical history, past surgical history, social history and family history with the patient and they are unchanged from previous note.   ALLERGIES:   has No Known Allergies.   MEDICATIONS:  Current Outpatient Medications  Medication Sig Dispense Refill   acetaminophen (TYLENOL) 500 MG tablet Take 1,000 mg by mouth every 6 (six) hours as needed for moderate pain or headache.     albuterol (VENTOLIN HFA) 108 (90 Base) MCG/ACT inhaler 1 puff as needed for cough or shortness of breath     anastrozole (ARIMIDEX) 1 MG tablet Take 1 tablet (1 mg total) by mouth daily. 90 tablet 3   atorvastatin (LIPITOR) 20 MG tablet Take 20 mg by mouth daily.     buPROPion ER (WELLBUTRIN SR) 100 MG 12 hr tablet Take 100 mg by mouth daily.     gabapentin (NEURONTIN) 100 MG capsule Take 100-200 mg by mouth 2 (two) times daily as needed (for cramping/pain.).     glipiZIDE (GLUCOTROL) 5 MG tablet TAKE 1 TABLET BY MOUTH EVERY DAY 30 MINUTES  BEFORE BREAKFAST     ibuprofen (ADVIL) 200 MG tablet Take 400 mg by mouth every 6 (six) hours as needed for headache or moderate pain.     levothyroxine (SYNTHROID) 88 MCG tablet Take by mouth.     lidocaine (XYLOCAINE) 2 % solution Use as directed 15 mLs in the mouth or throat every 3 (three) hours as needed for mouth pain (swish and spit). 100 mL 0   lisinopril (ZESTRIL) 5 MG tablet Take 5 mg by mouth daily.     melatonin 5 MG TABS Take 10-15 mg by mouth at bedtime as needed (sleep).     metFORMIN (GLUCOPHAGE-XR) 500 MG 24 hr tablet Take 500 mg by mouth 2 (two) times daily.  1   oxyCODONE (ROXICODONE) 5 MG immediate release tablet Take 1 tablet (5 mg total) by mouth every 4 (four) hours as needed for severe pain or breakthrough pain. 10 tablet 0   Semaglutide,0.25 or 0.5MG/DOS, (OZEMPIC, 0.25 OR 0.5 MG/DOSE,) 2 MG/1.5ML SOPN Inject 0.5 mg into the skin once a week.     sucralfate (CARAFATE) 1 GM/10ML suspension Take 10 mLs (1 g total) by mouth 4 (four) times daily -  with meals and at bedtime. 420 mL 0   pantoprazole (PROTONIX) 20 MG tablet Take 1 tablet (20 mg total) by mouth daily for 15 days. 15 tablet 1   No current facility-administered medications for this visit.     PHYSICAL EXAMINATION: Performance status (  ECOG): 0 - Asymptomatic  Vitals:   11/29/21 1529  BP: (!) 143/82  Pulse: 87  Resp: 18  Temp: 98.5 F (36.9 C)  SpO2: 97%   Wt Readings from Last 3 Encounters:  11/29/21 136 lb (61.7 kg)  06/23/21 144 lb 13.5 oz (65.7 kg)  05/09/21 144 lb 14.4 oz (65.7 kg)   Physical Exam Vitals reviewed.  Constitutional:      Appearance: Normal appearance.  Cardiovascular:     Rate and Rhythm: Normal rate and regular rhythm.     Pulses: Normal pulses.     Heart sounds: Normal heart sounds.  Pulmonary:     Effort: Pulmonary effort is normal.     Breath sounds: Normal breath sounds.  Chest:  Breasts:    Right: Normal. No swelling, bleeding, inverted nipple, mass, nipple  discharge, skin change or tenderness.     Left: Swelling (lymphedema) present. No bleeding, inverted nipple, mass, nipple discharge, skin change (lumpectomy scar around areola) or tenderness.  Neurological:     General: No focal deficit present.     Mental Status: She is alert and oriented to person, place, and time.  Psychiatric:        Mood and Affect: Mood normal.        Behavior: Behavior normal.     Breast Exam Chaperone: Adonis Huguenin   LABORATORY DATA:  I have reviewed the data as listed    Latest Ref Rng & Units 11/22/2021    9:47 AM 05/02/2021    1:16 PM 12/12/2020   10:17 AM  CMP  Glucose 70 - 99 mg/dL 98  162  57   BUN 8 - 23 mg/dL 15  17  13    Creatinine 0.44 - 1.00 mg/dL 0.72  0.81  0.74   Sodium 135 - 145 mmol/L 139  138  139   Potassium 3.5 - 5.1 mmol/L 4.0  3.9  4.1   Chloride 98 - 111 mmol/L 107  104  104   CO2 22 - 32 mmol/L 26  27  26    Calcium 8.9 - 10.3 mg/dL 9.3  9.1  9.3   Total Protein 6.5 - 8.1 g/dL 7.3  6.5    Total Bilirubin 0.3 - 1.2 mg/dL 0.6  0.3    Alkaline Phos 38 - 126 U/L 51  52    AST 15 - 41 U/L 13  18    ALT 0 - 44 U/L 12  19     No results found for: "CAN153" Lab Results  Component Value Date   WBC 6.2 11/22/2021   HGB 12.6 11/22/2021   HCT 38.4 11/22/2021   MCV 99.5 11/22/2021   PLT 219 11/22/2021   NEUTROABS 3.8 11/22/2021    ASSESSMENT:  Stage I (PT1CPN0) left breast IDC: - Left breast 12:30 o'clock biopsy consistent with IDC, ER/PR positive, HER2 negative by FISH - Lumpectomy and SLNB on 12/13/2020, pathology with 1.3 cm low-grade, margins negative, ER/PR positive, HER2 negative, Ki-67 5%.  0/2 lymph nodes on SLNB. - Oncotype DX recurrence score 19.  Distant recurrence risk at 9 years with AI/tamoxifen alone was 6%.  Absolute chemotherapy benefit was less than 1%. - Anastrozole 1 mg daily started on 01/31/2021. - XRT to left breast from 02/01/2021 through 03/05/2021.  2.  Social/family history: - She works at a daycare.   She is current active smoker, 1 pack/day for 40 years. - Brother had prostate cancer.  Father had cancer, type unknown to the patient.   PLAN:  Stage I left breast IDC, ER/PR positive, HER2 negative: -Physical examination today shows lumpectomy scar around the left areola within normal limits.  Lower part of the left breast has lymphedema.  There is a cystic lesion in the lower medial part of the left breast which is healed but slightly tender.  No other palpable masses or adenopathy. - Reviewed mammogram from 10/16/2021, BI-RADS Category 3.  We have scheduled for another mammogram of the left breast in December. - Chemistries and LFTs are within normal limits.  RTC 6 months for follow-up with repeat labs.  2.  Osteopenia: -DEXA scan (01/10/2021) T-score -1.4. - Vitamin D is 29.  Currently taking vitamin D12 100 units daily.  She will increase the dose.  Level will be repeated in 6 months.  3.  Tobacco abuse: -CT lung cancer screening scan on 01/25/2021 was lung RADS 2. - Will arrange for another scan in October.  Breast Cancer therapy associated bone loss: I have recommended calcium, Vitamin D and weight bearing exercises.  Orders placed this encounter:  Orders Placed This Encounter  Procedures   MM Digital Diagnostic Bilat   CT CHEST LUNG CA SCREEN LOW DOSE W/O CM   CBC with Differential   Comprehensive metabolic panel   Vitamin D 25 hydroxy     The patient has a good understanding of the overall plan. She agrees with it. She will call with any problems that may develop before the next visit here.  Derek Jack, MD [place] [phone]

## 2021-11-29 NOTE — Patient Instructions (Addendum)
[place] at [facility] Discharge Instructions  You were seen and examined today by Dr. Delton Coombes.  Dr. Delton Coombes discussed your most recent lab work which is stable.  Your recent mammogram was okay other than a calcification noted at the lumpectomy site. Dr. Delton Coombes has recommended a repeat mammogram at 6 months instead of 12 months to ensure it is a benign calcification.  Continue Anastrozole as prescribed.  Follow-up as scheduled.  Thank you for choosing [place] at [facility] to provide your oncology and hematology care.  To afford each patient quality time with our provider, please arrive at least 15 minutes before your scheduled appointment time.   If you have a lab appointment with the [facility] please come in thru the Main Entrance and check in at the main information desk.  You need to re-schedule your appointment should you arrive 10 or more minutes late.  We strive to give you quality time with our providers, and arriving late affects you and other patients whose appointments are after yours.  Also, if you no show three or more times for appointments you may be dismissed from the clinic at the providers discretion.     Again, thank you for choosing [facility].  Our hope is that these requests will decrease the amount of time that you wait before being seen by our physicians.       _____________________________________________________________  Should you have questions after your visit to [facility], please contact our office at [phone] and follow the prompts.  Our office hours are 8:00 a.m. and 4:30 p.m. Monday - Friday.  Please note that voicemails left after 4:00 p.m. may not be returned until the following business day.  We are closed weekends and major holidays.  You do have access to a nurse 24-7, just call the main number to the clinic [phone] and do not press any options, hold on  the line and a nurse will answer the phone.    For prescription refill requests, have your pharmacy contact our office and allow 72 hours.

## 2022-01-28 ENCOUNTER — Ambulatory Visit (HOSPITAL_COMMUNITY)
Admission: RE | Admit: 2022-01-28 | Discharge: 2022-01-28 | Disposition: A | Discharge status: Home / Self Care | Payer: BC Managed Care – PPO | Source: Ambulatory Visit | Attending: Hematology | Admitting: Hematology

## 2022-01-28 DIAGNOSIS — C50912 Malignant neoplasm of unspecified site of left female breast: Secondary | ICD-10-CM | POA: Diagnosis present

## 2022-03-18 ENCOUNTER — Other Ambulatory Visit (HOSPITAL_COMMUNITY): Payer: Self-pay | Admitting: Hematology

## 2022-03-19 ENCOUNTER — Other Ambulatory Visit: Payer: Self-pay | Admitting: *Deleted

## 2022-03-19 NOTE — Telephone Encounter (Signed)
Anastrozole refill approved.  Patient tolerating and is to continue therapy. 

## 2022-03-26 ENCOUNTER — Encounter: Payer: Self-pay | Admitting: *Deleted

## 2022-04-23 ENCOUNTER — Ambulatory Visit (HOSPITAL_COMMUNITY): Payer: Managed Care, Other (non HMO)

## 2022-04-23 ENCOUNTER — Inpatient Hospital Stay (HOSPITAL_COMMUNITY): Admission: RE | Admit: 2022-04-23 | Payer: BC Managed Care – PPO | Source: Ambulatory Visit

## 2022-04-23 ENCOUNTER — Encounter (HOSPITAL_COMMUNITY): Payer: BC Managed Care – PPO

## 2022-04-23 ENCOUNTER — Ambulatory Visit (HOSPITAL_COMMUNITY): Payer: BC Managed Care – PPO

## 2022-04-30 ENCOUNTER — Ambulatory Visit (HOSPITAL_COMMUNITY)
Admission: RE | Admit: 2022-04-30 | Discharge: 2022-04-30 | Disposition: A | Discharge status: Home / Self Care | Payer: 59 | Source: Ambulatory Visit | Attending: Hematology | Admitting: Hematology

## 2022-04-30 DIAGNOSIS — C50912 Malignant neoplasm of unspecified site of left female breast: Secondary | ICD-10-CM

## 2022-05-30 ENCOUNTER — Inpatient Hospital Stay: Payer: 59 | Attending: Hematology

## 2022-05-30 DIAGNOSIS — Z79811 Long term (current) use of aromatase inhibitors: Secondary | ICD-10-CM | POA: Insufficient documentation

## 2022-05-30 DIAGNOSIS — M8580 Other specified disorders of bone density and structure, unspecified site: Secondary | ICD-10-CM | POA: Diagnosis not present

## 2022-05-30 DIAGNOSIS — Z23 Encounter for immunization: Secondary | ICD-10-CM | POA: Insufficient documentation

## 2022-05-30 DIAGNOSIS — Z170 Estrogen receptor positive status [ER+]: Secondary | ICD-10-CM | POA: Diagnosis not present

## 2022-05-30 DIAGNOSIS — C50812 Malignant neoplasm of overlapping sites of left female breast: Secondary | ICD-10-CM | POA: Diagnosis present

## 2022-05-30 DIAGNOSIS — F17210 Nicotine dependence, cigarettes, uncomplicated: Secondary | ICD-10-CM | POA: Insufficient documentation

## 2022-05-30 DIAGNOSIS — C50912 Malignant neoplasm of unspecified site of left female breast: Secondary | ICD-10-CM

## 2022-05-30 LAB — CBC WITH DIFFERENTIAL/PLATELET
Abs Immature Granulocytes: 0.02 10*3/uL (ref 0.00–0.07)
Basophils Absolute: 0 10*3/uL (ref 0.0–0.1)
Basophils Relative: 1 %
Eosinophils Absolute: 0.2 10*3/uL (ref 0.0–0.5)
Eosinophils Relative: 4 %
HCT: 35.4 % — ABNORMAL LOW (ref 36.0–46.0)
Hemoglobin: 11.4 g/dL — ABNORMAL LOW (ref 12.0–15.0)
Immature Granulocytes: 0 %
Lymphocytes Relative: 38 %
Lymphs Abs: 2.2 10*3/uL (ref 0.7–4.0)
MCH: 31.8 pg (ref 26.0–34.0)
MCHC: 32.2 g/dL (ref 30.0–36.0)
MCV: 98.9 fL (ref 80.0–100.0)
Monocytes Absolute: 0.5 10*3/uL (ref 0.1–1.0)
Monocytes Relative: 8 %
Neutro Abs: 2.8 10*3/uL (ref 1.7–7.7)
Neutrophils Relative %: 49 %
Platelets: 227 10*3/uL (ref 150–400)
RBC: 3.58 MIL/uL — ABNORMAL LOW (ref 3.87–5.11)
RDW: 12.4 % (ref 11.5–15.5)
WBC: 5.7 10*3/uL (ref 4.0–10.5)
nRBC: 0 % (ref 0.0–0.2)

## 2022-05-30 LAB — COMPREHENSIVE METABOLIC PANEL
ALT: 13 U/L (ref 0–44)
AST: 16 U/L (ref 15–41)
Albumin: 4.1 g/dL (ref 3.5–5.0)
Alkaline Phosphatase: 53 U/L (ref 38–126)
Anion gap: 10 (ref 5–15)
BUN: 16 mg/dL (ref 8–23)
CO2: 23 mmol/L (ref 22–32)
Calcium: 9.3 mg/dL (ref 8.9–10.3)
Chloride: 103 mmol/L (ref 98–111)
Creatinine, Ser: 0.86 mg/dL (ref 0.44–1.00)
GFR, Estimated: >60 mL/min (ref >60)
Glucose, Bld: 89 mg/dL (ref 70–99)
Potassium: 4.2 mmol/L (ref 3.5–5.1)
Sodium: 136 mmol/L (ref 135–145)
Total Bilirubin: 0.4 mg/dL (ref 0.3–1.2)
Total Protein: 7 g/dL (ref 6.5–8.1)

## 2022-06-03 LAB — VITAMIN D 25 HYDROXY (VIT D DEFICIENCY, FRACTURES)

## 2022-06-06 ENCOUNTER — Encounter: Payer: Self-pay | Admitting: Hematology

## 2022-06-06 ENCOUNTER — Inpatient Hospital Stay: Payer: 59

## 2022-06-06 ENCOUNTER — Encounter: Payer: Self-pay | Admitting: *Deleted

## 2022-06-06 ENCOUNTER — Other Ambulatory Visit (HOSPITAL_COMMUNITY): Payer: Self-pay | Admitting: Hematology

## 2022-06-06 ENCOUNTER — Inpatient Hospital Stay: Payer: 59 | Admitting: Hematology

## 2022-06-06 VITALS — BP 116/73 | HR 92 | Temp 97.9°F | Resp 16 | Wt 132.0 lb

## 2022-06-06 DIAGNOSIS — C50912 Malignant neoplasm of unspecified site of left female breast: Secondary | ICD-10-CM

## 2022-06-06 DIAGNOSIS — Z853 Personal history of malignant neoplasm of breast: Secondary | ICD-10-CM

## 2022-06-06 DIAGNOSIS — C50812 Malignant neoplasm of overlapping sites of left female breast: Secondary | ICD-10-CM | POA: Diagnosis not present

## 2022-06-06 MED ORDER — INFLUENZA VAC SPLIT QUAD 0.5 ML IM SUSY
0.5000 mL | PREFILLED_SYRINGE | Freq: Once | INTRAMUSCULAR | Status: AC
Start: 2022-06-06 — End: 2022-06-06
  Administered 2022-06-06: 0.5 mL via INTRAMUSCULAR
  Filled 2022-06-06: qty 0.5

## 2022-06-06 NOTE — Progress Notes (Signed)
[facility] [address], [place] [address]    Clinic Day:  06/06/2022  Referring physician: Leonie Douglas, MD  Patient Care Team: Leonie Douglas, MD as PCP - General (Family Medicine) Brien Mates, RN as Oncology Nurse Navigator (Oncology) Derek Jack, MD as Medical Oncologist (Oncology)   ASSESSMENT & PLAN:   Assessment: Stage I (PT1CPN0) left breast IDC: - Left breast 12:30 o'clock biopsy consistent with IDC, ER/PR positive, HER2 negative by FISH - Lumpectomy and SLNB on 12/13/2020, pathology with 1.3 cm low-grade, margins negative, ER/PR positive, HER2 negative, Ki-67 5%.  0/2 lymph nodes on SLNB. - Oncotype DX recurrence score 19.  Distant recurrence risk at 9 years with AI/tamoxifen alone was 6%.  Absolute chemotherapy benefit was less than 1%. - Anastrozole 1 mg daily started on 01/31/2021. - XRT to left breast from 02/01/2021 through 03/05/2021.  2.  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 - Brother had prostate cancer.  Father had cancer, type unknown to the patient.    Plan: Stage I left breast IDC, ER/PR positive, HER2 negative: - Physical examination today shows lumpectomy scar around the left areola within normal limits.  Previous lymphedema is completely gone.  No palpable masses or adenopathy. - Mammogram of the left breast on 04/30/2022 BI-RADS Category 3. - Labs from 05/30/2022 with normal LFTs. - Will schedule her for bilateral diagnostic mammogram in June of this year.  RTC 6 6 months for follow-up.  2.  Osteopenia (DEXA 01/10/2021 T-score -1.4): - Vitamin D level is 29.  She is taking calcium plus D 1200 mg daily. - I have recommended that she take an additional vitamin D tablet 1000 units once weekly.  3.  Tobacco abuse: - CT lung cancer screening scan from 01/28/2022 discussed with patient with lung RADS 2 findings. - Will plan to repeat another scan in  12 months.   Breast Cancer therapy associated bone loss: I have recommended calcium, Vitamin D and weight bearing exercises.  No orders of the defined types were placed in this encounter.     Beverly Gust Oliver,acting as a scribe for Derek Jack, MD.,have documented all relevant documentation on the behalf of Derek Jack, MD,as directed by  Derek Jack, MD while in the presence of Derek Jack, MD.   I, Derek Jack MD, have reviewed the above documentation for accuracy and completeness, and I agree with the above.   Doyce Loose   2/15/20243:40 PM  CHIEF COMPLAINT:   Diagnosis: left breast cancer     Cancer Staging  Invasive ductal carcinoma of left breast [facility]) Staging form: Breast, AJCC 8th Edition - Clinical stage from 01/01/2021: Stage IA (cT1c, cN0, cM0, G1, ER+, PR+, HER2-) - Unsigned    Prior Therapy: Lumpectomy and SLNB followed by XRT  Current Therapy: Anastrozole   HISTORY OF PRESENT ILLNESS:   Oncology History  Invasive ductal carcinoma of left breast ([place])  01/09/2021 Genetic Testing   Oncotype DX:         INTERVAL HISTORY:   Denise Sanders is a 67 y.o. female presenting to clinic today for follow up of left breast cancer  . She was last seen by me on 11/29/2021%.  Today, she states that she is doing well overall. Her appetite level is at 100%. Her energy level is at 0.She's doing okay with  anastrozole. She denies hot flashes, aches, and pains. She received a mammogram in 03/2022.  She takes vitamin D and Calcium supplements.   PAST MEDICAL HISTORY:   Past Medical History: Past Medical History:  Diagnosis Date   Arthritis    Diabetes mellitus without complication (HCC)    GERD (gastroesophageal reflux disease)    Hypertension    Hypothyroidism     Surgical History: Past Surgical History:  Procedure Laterality Date   ABDOMINAL HYSTERECTOMY     ANTERIOR AND POSTERIOR REPAIR N/A 09/22/2017   Procedure:  ANTERIOR (CYSTOCELE) AND POSTERIOR REPAIR (RECTOCELE);  Surgeon: Schermerhorn, Gwen Her, MD;  Location: [facility] ORS;  Service: Gynecology;  Laterality: N/A;   APPENDECTOMY     BILATERAL SALPINGECTOMY Bilateral 09/22/2017   Procedure: BILATERAL SALPINGECTOMY;  Surgeon: Schermerhorn, Gwen Her, MD;  Location: [facility] ORS;  Service: Gynecology;  Laterality: Bilateral;   BREAST LUMPECTOMY WITH RADIOFREQUENCY TAG IDENTIFICATION Left [id number]   Procedure: BREAST LUMPECTOMY WITH RADIOFREQUENCY TAG IDENTIFICATION;  Surgeon: Virl Cagey, MD;  Location: [facility];  Service: General;  Laterality: Left;   CATARACT EXTRACTION W/PHACO Right 08/25/2020   Procedure: CATARACT EXTRACTION PHACO AND INTRAOCULAR LENS PLACEMENT RIGHT EYE;  Surgeon: Baruch Goldmann, MD;  Location: [facility];  Service: Ophthalmology;  Laterality: Right;  CDE  7.01   CATARACT EXTRACTION W/PHACO Left 10/20/2020   Procedure: CATARACT EXTRACTION PHACO AND INTRAOCULAR LENS PLACEMENT (IOC);  Surgeon: Baruch Goldmann, MD;  Location: [facility];  Service: Ophthalmology;  Laterality: Left;  CDE: 7.61   COLONOSCOPY WITH PROPOFOL N/A 12/16/2017   Procedure: COLONOSCOPY WITH PROPOFOL;  Surgeon: Toledo, Benay Pike, MD;  Location: [facility];  Service: Gastroenterology;  Laterality: N/A;  (+) DM - oral   PARTIAL MASTECTOMY WITH AXILLARY SENTINEL LYMPH NODE BIOPSY Left 12/13/2020   Procedure: PARTIAL MASTECTOMY WITH AXILLARY SENTINEL LYMPH NODE BIOPSY;  Surgeon: Virl Cagey, MD;  Location: [facility];  Service: General;  Laterality: Left;   TRACHEOSTOMY  2018   VAGINAL HYSTERECTOMY N/A 09/22/2017   Procedure: HYSTERECTOMY VAGINAL;  Surgeon: Boykin Nearing, MD;  Location: [facility] OR[facility];  Service: Gynecology;  Laterality: N/A;    Social History: Social History   Socioeconomic History   Marital status: Single    Spouse name: Not on file   Number of children: Not on file   Years of education: Not on file   Highest education level: Not on file  Occupational  History   Not on file  Tobacco Use   Smoking status: Every Day    Packs/day: 0.50    Years: 39.00    Total pack years: 19.50    Types: Cigarettes   Smokeless tobacco: Never  Vaping Use   Vaping Use: Former  Substance and Sexual Activity   Alcohol use: Yes    Alcohol/week: 7.0 standard drinks of alcohol    Types: 7 Glasses of wine per week   Drug use: No   Sexual activity: Yes  Other Topics Concern   Not on file  Social History Narrative   Not on file   Social Determinants of Health   Financial Resource Strain: Medium Risk (01/01/2021)   Overall Financial Resource Strain (CARDIA)    Difficulty of Paying Living Expenses: Somewhat hard  Food Insecurity: No Food Insecurity (01/01/2021)   Hunger Vital Sign    Worried About Running Out of Food in the Last Year: Never true    Ran Out of Food in the Last Year: Never true  Transportation Needs: No Transportation Needs (01/01/2021)   PRAPARE - Hydrologist (Medical): No  Lack of Transportation (Non-Medical): No  Physical Activity: Inactive (01/01/2021)   Exercise Vital Sign    Days of Exercise per Week: 0 days    Minutes of Exercise per Session: 0 min  Stress: No Stress Concern Present (01/01/2021)   [place]    Feeling of Stress : Not at all  Social Connections: Moderately Isolated (01/01/2021)   Social Connection and Isolation Panel [NHANES]    Frequency of Communication with Friends and Family: More than three times a week    Frequency of Social Gatherings with Friends and Family: More than three times a week    Attends Religious Services: More than 4 times per year    Active Member of [employer] or Organizations: No    Attends Archivist Meetings: Never    Marital Status: Never married  Intimate Partner Violence: Not At Risk (01/01/2021)   Humiliation, Afraid, Rape, and Kick questionnaire    Fear of Current or Ex-Partner: No     Emotionally Abused: No    Physically Abused: No    Sexually Abused: No    Family History: History reviewed. No pertinent family history.  Current Medications:  Current Outpatient Medications:    acetaminophen (TYLENOL) 500 MG tablet, Take 1,000 mg by mouth every 6 (six) hours as needed for moderate pain or headache., Disp: , Rfl:    albuterol (VENTOLIN HFA) 108 (90 Base) MCG/ACT inhaler, 1 puff as needed for cough or shortness of breath, Disp: , Rfl:    anastrozole (ARIMIDEX) 1 MG tablet, TAKE 1 TABLET BY MOUTH EVERY DAY, Disp: 90 tablet, Rfl: 3   atorvastatin (LIPITOR) 20 MG tablet, Take 20 mg by mouth daily., Disp: , Rfl:    buPROPion ER (WELLBUTRIN SR) 100 MG 12 hr tablet, Take 100 mg by mouth daily., Disp: , Rfl:    gabapentin (NEURONTIN) 100 MG capsule, Take 100-200 mg by mouth 2 (two) times daily as needed (for cramping/pain.)., Disp: , Rfl:    glipiZIDE (GLUCOTROL) 5 MG tablet, TAKE 1 TABLET BY MOUTH EVERY DAY 30 MINUTES BEFORE BREAKFAST, Disp: , Rfl:    ibuprofen (ADVIL) 200 MG tablet, Take 400 mg by mouth every 6 (six) hours as needed for headache or moderate pain., Disp: , Rfl:    levothyroxine (SYNTHROID) 88 MCG tablet, Take by mouth., Disp: , Rfl:    lidocaine (XYLOCAINE) 2 % solution, Use as directed 15 mLs in the mouth or throat every 3 (three) hours as needed for mouth pain (swish and spit)., Disp: 100 mL, Rfl: 0   lisinopril (ZESTRIL) 5 MG tablet, Take 5 mg by mouth daily., Disp: , Rfl:    melatonin 5 MG TABS, Take 10-15 mg by mouth at bedtime as needed (sleep)., Disp: , Rfl:    metFORMIN (GLUCOPHAGE-XR) 500 MG 24 hr tablet, Take 500 mg by mouth 2 (two) times daily., Disp: , Rfl: 1   Semaglutide,0.25 or 0.5MG/DOS, (OZEMPIC, 0.25 OR 0.5 MG/DOSE,) 2 MG/1.5ML SOPN, Inject 0.5 mg into the skin once a week., Disp: , Rfl:    sucralfate (CARAFATE) 1 GM/10ML suspension, Take 10 mLs (1 g total) by mouth 4 (four) times daily -  with meals and at bedtime., Disp: 420 mL, Rfl: 0    varenicline (CHANTIX) 1 MG tablet, Take 1 tablet by mouth 2 (two) times daily., Disp: , Rfl:    Allergies: No Known Allergies  REVIEW OF SYSTEMS:   Review of Systems  Constitutional:  Negative for chills, fatigue and  fever.  HENT:   Negative for lump/mass, mouth sores, nosebleeds, sore throat and trouble swallowing.   Eyes:  Negative for eye problems.  Respiratory:  Negative for cough and shortness of breath.   Cardiovascular:  Negative for chest pain, leg swelling and palpitations.  Gastrointestinal:  Negative for abdominal pain, constipation, diarrhea, nausea and vomiting.  Genitourinary:  Negative for bladder incontinence, difficulty urinating, dysuria, frequency, hematuria and nocturia.   Musculoskeletal:  Negative for arthralgias, back pain, flank pain, myalgias and neck pain.  Skin:  Negative for itching and rash.  Neurological:  Negative for dizziness, headaches and numbness.  Hematological:  Does not bruise/bleed easily.  Psychiatric/Behavioral:  Negative for depression, sleep disturbance and suicidal ideas. The patient is not nervous/anxious.   All other systems reviewed and are negative.    VITALS:   Blood pressure 116/73, pulse 92, temperature 97.9 F (36.6 C), temperature source Oral, resp. rate 16, weight 132 lb (59.9 kg), SpO2 99 %.  Wt Readings from Last 3 Encounters:  06/06/22 132 lb (59.9 kg)  11/29/21 136 lb (61.7 kg)  06/23/21 144 lb 13.5 oz (65.7 kg)    Body mass index is 23.38 kg/m.  Performance status (ECOG): 1 - Symptomatic but completely ambulatory  PHYSICAL EXAM:   Physical Exam Vitals and nursing note reviewed. Exam conducted with a chaperone present.  Constitutional:      Appearance: Normal appearance.  Cardiovascular:     Rate and Rhythm: Normal rate and regular rhythm.     Pulses: Normal pulses.     Heart sounds: Normal heart sounds.  Pulmonary:     Effort: Pulmonary effort is normal.     Breath sounds: Normal breath sounds.  Chest:      Comments:  Left breast lumpectomy scar WNL Abdominal:     Palpations: Abdomen is soft. There is no hepatomegaly, splenomegaly or mass.     Tenderness: There is no abdominal tenderness.  Musculoskeletal:     Right lower leg: No edema.     Left lower leg: No edema.  Lymphadenopathy:     Cervical: No cervical adenopathy.     Right cervical: No superficial, deep or posterior cervical adenopathy.    Left cervical: No superficial, deep or posterior cervical adenopathy.     Upper Body:     Right upper body: No supraclavicular or axillary adenopathy.     Left upper body: No supraclavicular or axillary adenopathy.  Neurological:     General: No focal deficit present.     Mental Status: She is alert and oriented to person, place, and time.  Psychiatric:        Mood and Affect: Mood normal.        Behavior: Behavior normal.     LABS:      Latest Ref Rng & Units 05/30/2022    2:37 PM 11/22/2021    9:47 AM 05/02/2021    1:16 PM  CBC  WBC 4.0 - 10.5 K/uL 5.7  6.2  5.0   Hemoglobin 12.0 - 15.0 g/dL 11.4  12.6  11.6   Hematocrit 36.0 - 46.0 % 35.4  38.4  34.8   Platelets 150 - 400 K/uL 227  219  193       Latest Ref Rng & Units 05/30/2022    2:37 PM 11/22/2021    9:47 AM 05/02/2021    1:16 PM  CMP  Glucose 70 - 99 mg/dL 89  98  162   BUN 8 - 23 mg/dL 16  15  17   Creatinine 0.44 - 1.00 mg/dL 0.86  0.72  0.81   Sodium 135 - 145 mmol/L 136  139  138   Potassium 3.5 - 5.1 mmol/L 4.2  4.0  3.9   Chloride 98 - 111 mmol/L 103  107  104   CO2 22 - 32 mmol/L 23  26  27   $ Calcium 8.9 - 10.3 mg/dL 9.3  9.3  9.1   Total Protein 6.5 - 8.1 g/dL 7.0  7.3  6.5   Total Bilirubin 0.3 - 1.2 mg/dL 0.4  0.6  0.3   Alkaline Phos 38 - 126 U/L 53  51  52   AST 15 - 41 U/L 16  13  18   $ ALT 0 - 44 U/L 13  12  19      $ No results found for: "CEA1", "CEA" / No results found for: "CEA1", "CEA" No results found for: "PSA1" No results found for: "[id number]" No results found for: "CAN125"  No results found for:  "TOTALPROTELP", "ALBUMINELP", "A1GS", "A2GS", "BETS", "BETA2SER", "GAMS", "MSPIKE", "SPEI" No results found for: "TIBC", "FERRITIN", "IRONPCTSAT" No results found for: "LDH"   STUDIES:   No results found.

## 2022-06-06 NOTE — Progress Notes (Signed)
Patient taking Anastrozole as prescribed and is tolerating well.  Refills given today.

## 2022-06-06 NOTE — Patient Instructions (Signed)

## 2022-06-06 NOTE — Patient Instructions (Signed)
[facility]  Discharge Instructions  You were seen and examined today by Dr. Delton Coombes.  Dr. Delton Coombes discussed your most recent lab work which revealed that everything looks good except your Vitamin D is slightly low.  Start taking one tablet of Vitamin D once weekly in addition to your calcium and vitamin D you are already taking.  Follow-up as scheduled in 6 months.    Thank you for choosing [facility] to provide your oncology and hematology care.   To afford each patient quality time with our provider, please arrive at least 15 minutes before your scheduled appointment time. You may need to reschedule your appointment if you arrive late (10 or more minutes). Arriving late affects you and other patients whose appointments are after yours.  Also, if you miss three or more appointments without notifying the office, you may be dismissed from the clinic at the provider's discretion.    Again, thank you for choosing [facility].  Our hope is that these requests will decrease the amount of time that you wait before being seen by our physicians.   If you have a lab appointment with the [facility] please come in thru the Main Entrance and check in at the main information desk.           _____________________________________________________________  Should you have questions after your visit to [facility], please contact our office at [phone] and follow the prompts.  Our office hours are 8:00 a.m. to 4:30 p.m. Monday - Thursday and 8:00 a.m. to 2:30 p.m. Friday.  Please note that voicemails left after 4:00 p.m. may not be returned until the following business day.  We are closed weekends and all major holidays.  You do have access to a nurse 24-7, just call the main number to the clinic [phone] and do not press any options, hold on the line and a nurse will answer the phone.    For prescription  refill requests, have your pharmacy contact our office and allow 72 hours.    Masks are optional in the cancer centers. If you would like for your care team to wear a mask while they are taking care of you, please let them know. You may have one support person who is at least 65 years old accompany you for your appointments.

## 2022-06-06 NOTE — Progress Notes (Signed)
Flu vaccine given in L deltoid without incident.  Site CDI.  Patient discharged ambulatory in stable condition.

## 2022-07-04 ENCOUNTER — Encounter: Payer: Self-pay | Admitting: *Deleted

## 2022-07-12 ENCOUNTER — Other Ambulatory Visit: Payer: Self-pay | Admitting: Cardiology

## 2022-07-12 LAB — GLUCOSE, POCT (MANUAL RESULT ENTRY): POC Glucose: 124 mg/dl — AB (ref 70–99)

## 2022-07-14 LAB — LIPID PANEL W/O CHOL/HDL RATIO
Cholesterol, Total: 168 mg/dL (ref 100–199)
HDL: 74 mg/dL (ref >39)
LDL Chol Calc (NIH): 74 mg/dL (ref 0–99)
Triglycerides: 116 mg/dL (ref 0–149)
VLDL Cholesterol Cal: 20 mg/dL (ref 5–40)

## 2022-07-15 ENCOUNTER — Other Ambulatory Visit: Payer: Self-pay

## 2022-07-15 DIAGNOSIS — Z139 Encounter for screening, unspecified: Secondary | ICD-10-CM

## 2022-11-05 ENCOUNTER — Ambulatory Visit (HOSPITAL_COMMUNITY)
Admission: RE | Admit: 2022-11-05 | Discharge: 2022-11-05 | Disposition: A | Discharge status: Home / Self Care | Payer: 59 | Source: Ambulatory Visit | Attending: Hematology | Admitting: Hematology

## 2022-11-05 DIAGNOSIS — Z853 Personal history of malignant neoplasm of breast: Secondary | ICD-10-CM

## 2022-11-05 DIAGNOSIS — C50912 Malignant neoplasm of unspecified site of left female breast: Secondary | ICD-10-CM | POA: Insufficient documentation

## 2022-12-05 ENCOUNTER — Inpatient Hospital Stay: Payer: No Typology Code available for payment source | Attending: Hematology

## 2022-12-05 DIAGNOSIS — Z170 Estrogen receptor positive status [ER+]: Secondary | ICD-10-CM | POA: Insufficient documentation

## 2022-12-05 DIAGNOSIS — Z79811 Long term (current) use of aromatase inhibitors: Secondary | ICD-10-CM | POA: Insufficient documentation

## 2022-12-05 DIAGNOSIS — F17210 Nicotine dependence, cigarettes, uncomplicated: Secondary | ICD-10-CM | POA: Diagnosis not present

## 2022-12-05 DIAGNOSIS — C50812 Malignant neoplasm of overlapping sites of left female breast: Secondary | ICD-10-CM | POA: Diagnosis present

## 2022-12-05 DIAGNOSIS — C50912 Malignant neoplasm of unspecified site of left female breast: Secondary | ICD-10-CM

## 2022-12-05 DIAGNOSIS — M8580 Other specified disorders of bone density and structure, unspecified site: Secondary | ICD-10-CM | POA: Insufficient documentation

## 2022-12-05 LAB — COMPREHENSIVE METABOLIC PANEL
ALT: 17 U/L (ref 0–44)
AST: 14 U/L — ABNORMAL LOW (ref 15–41)
Albumin: 4.1 g/dL (ref 3.5–5.0)
Alkaline Phosphatase: 56 U/L (ref 38–126)
Anion gap: 15 (ref 5–15)
BUN: 21 mg/dL (ref 8–23)
CO2: 20 mmol/L — ABNORMAL LOW (ref 22–32)
Calcium: 9.3 mg/dL (ref 8.9–10.3)
Chloride: 96 mmol/L — ABNORMAL LOW (ref 98–111)
Creatinine, Ser: 0.95 mg/dL (ref 0.44–1.00)
GFR, Estimated: >60 mL/min (ref >60)
Glucose, Bld: 423 mg/dL — ABNORMAL HIGH (ref 70–99)
Potassium: 4.1 mmol/L (ref 3.5–5.1)
Sodium: 131 mmol/L — ABNORMAL LOW (ref 135–145)
Total Bilirubin: 0.2 mg/dL — ABNORMAL LOW (ref 0.3–1.2)
Total Protein: 7.3 g/dL (ref 6.5–8.1)

## 2022-12-05 LAB — CBC WITH DIFFERENTIAL/PLATELET
Abs Immature Granulocytes: 0.06 10*3/uL (ref 0.00–0.07)
Basophils Absolute: 0 10*3/uL (ref 0.0–0.1)
Basophils Relative: 0 %
Eosinophils Absolute: 0 10*3/uL (ref 0.0–0.5)
Eosinophils Relative: 0 %
HCT: 37.3 % (ref 36.0–46.0)
Hemoglobin: 12 g/dL (ref 12.0–15.0)
Immature Granulocytes: 1 %
Lymphocytes Relative: 15 %
Lymphs Abs: 1.5 10*3/uL (ref 0.7–4.0)
MCH: 31.7 pg (ref 26.0–34.0)
MCHC: 32.2 g/dL (ref 30.0–36.0)
MCV: 98.4 fL (ref 80.0–100.0)
Monocytes Absolute: 0.4 10*3/uL (ref 0.1–1.0)
Monocytes Relative: 5 %
Neutro Abs: 7.6 10*3/uL (ref 1.7–7.7)
Neutrophils Relative %: 79 %
Platelets: 286 10*3/uL (ref 150–400)
RBC: 3.79 MIL/uL — ABNORMAL LOW (ref 3.87–5.11)
RDW: 12.7 % (ref 11.5–15.5)
WBC: 9.6 10*3/uL (ref 4.0–10.5)
nRBC: 0 % (ref 0.0–0.2)

## 2022-12-05 LAB — VITAMIN D 25 HYDROXY (VIT D DEFICIENCY, FRACTURES): Vit D, 25-Hydroxy: 31.69 ng/mL (ref 30–100)

## 2022-12-12 ENCOUNTER — Other Ambulatory Visit: Payer: Self-pay

## 2022-12-12 ENCOUNTER — Encounter: Payer: Self-pay | Admitting: Hematology

## 2022-12-12 ENCOUNTER — Inpatient Hospital Stay: Payer: No Typology Code available for payment source | Admitting: Hematology

## 2022-12-12 VITALS — BP 120/79 | HR 99 | Temp 97.8°F | Resp 16 | Ht 63.0 in | Wt 139.1 lb

## 2022-12-12 DIAGNOSIS — Z87891 Personal history of nicotine dependence: Secondary | ICD-10-CM | POA: Diagnosis not present

## 2022-12-12 DIAGNOSIS — C50912 Malignant neoplasm of unspecified site of left female breast: Secondary | ICD-10-CM

## 2022-12-12 DIAGNOSIS — Z122 Encounter for screening for malignant neoplasm of respiratory organs: Secondary | ICD-10-CM | POA: Diagnosis not present

## 2022-12-12 DIAGNOSIS — C50812 Malignant neoplasm of overlapping sites of left female breast: Secondary | ICD-10-CM | POA: Diagnosis not present

## 2022-12-12 NOTE — Progress Notes (Signed)
[facility] [address], [place] [address]    Clinic Day:  12/12/2022  Referring physician: Waldon Reining, MD  Patient Care Team: Waldon Reining, MD as PCP - General (Family Medicine) Therese Sarah, RN as Oncology Nurse Navigator (Oncology) Doreatha Massed, MD as Medical Oncologist (Oncology)   ASSESSMENT & PLAN:   Assessment: Stage I (PT1CPN0) left breast IDC: - Left breast 12:30 o'clock biopsy consistent with IDC, ER/PR positive, HER2 negative by FISH - Lumpectomy and SLNB on 12/13/2020, pathology with 1.3 cm low-grade, margins negative, ER/PR positive, HER2 negative, Ki-67 5%.  0/2 lymph nodes on SLNB. - Oncotype DX recurrence score 19.  Distant recurrence risk at 9 years with AI/tamoxifen alone was 6%.  Absolute chemotherapy benefit was less than 1%. - Anastrozole 1 mg daily started on 01/31/2021. - XRT to left breast from 02/01/2021 through 03/05/2021.  2.  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 - Brother had prostate cancer.  Father had cancer, type unknown to the patient.    Plan: Stage I left breast IDC, ER/PR positive, HER2 negative: - Physical exam today: Lumpectomy scar around the left areola within normal limits.  No masses palpable.  No lymphadenopathy. - Labs today: Normal LFTs and creatinine.  CBC grossly normal. - Mammogram on 11/05/2022: Benign. - Continue anastrozole daily for 5 years. - RTC 6 months for follow-up.  2.  Osteopenia (DEXA 01/10/2021 T-score -1.4): - Continue vitamin D and calcium supplements. - Vitamin D is normal at 31. - Plan to repeat DEXA scan prior to next visit.  3.  Tobacco abuse: - She had CT scan on 01/28/2022: Lung RADS 2. - She continues to smoke half pack cigarettes per day.  Plan to repeat another CT scan in October.   Breast Cancer therapy associated bone loss: I have recommended calcium, Vitamin D and weight bearing  exercises.  Orders Placed This Encounter  Procedures   CT CHEST LUNG CA SCREEN LOW DOSE W/O CM    Standing Status:   Future    Standing Expiration Date:   12/12/2023    Order Specific Question:   Preferred Imaging Location?    Answer:   [facility]    Order Specific Question:   Release to patient    Answer:   Immediate [1]   DG Bone Density    Standing Status:   Future    Standing Expiration Date:   12/12/2023    Order Specific Question:   Reason for Exam (SYMPTOM  OR DIAGNOSIS REQUIRED)    Answer:   hx of breast cancer    Order Specific Question:   Preferred imaging location?    Answer:   [facility]    Order Specific Question:   Release to patient    Answer:   Immediate   CBC with Differential/Platelet    Standing Status:   Future    Standing Expiration Date:   12/12/2023    Order Specific Question:   Release to patient    Answer:   Immediate   Comprehensive metabolic panel    Standing Status:   Future    Standing Expiration Date:   12/12/2023    Order Specific Question:   Release to patient    Answer:   Immediate   VITAMIN D 25 Hydroxy (Vit-D Deficiency, Fractures)    Standing Status:   Future    Standing Expiration Date:   12/12/2023  Order Specific Question:   Release to patient    Answer:   Immediate      I,Helena R Teague,acting as a scribe for Doreatha Massed, MD.,have documented all relevant documentation on the behalf of Doreatha Massed, MD,as directed by  Doreatha Massed, MD while in the presence of Doreatha Massed, MD.  I, Doreatha Massed MD, have reviewed the above documentation for accuracy and completeness, and I agree with the above.    Doreatha Massed, MD   8/22/20244:18 PM  CHIEF COMPLAINT:   Diagnosis: left breast cancer     Cancer Staging  Invasive ductal carcinoma of left breast [facility]) Staging form: Breast, AJCC 8th Edition - Clinical stage from 01/01/2021: Stage IA (cT1c, cN0, cM0, G1, ER+, PR+, HER2-) -  Unsigned    Prior Therapy: Lumpectomy and SLNB followed by XRT  Current Therapy: Anastrozole   HISTORY OF PRESENT ILLNESS:   Oncology History  Invasive ductal carcinoma of left breast (HCC)  01/09/2021 Genetic Testing   Oncotype DX:         INTERVAL HISTORY:   Denise Sanders is a 67 y.o. female presenting to clinic today for follow up of left breast cancer. She was last seen by me on 06/06/22.  Since her last visit, she underwent a bilateral MM on 11/05/22 that found: stable lumpectomy changes with small likely dystrophic calcification at the lumpectomy site in the left breast and likely inflamed Montgomery tubercle at the site of concern on the right nipple.  Today, she states that she is doing well overall. Her appetite level is at 75%. Her energy level is at 100%.  She is tolerating Anastrozole well without any issues. She denies any hot flashes, aches, or pains from treatment. She is still smoking 0.5 ppd.   She reports during her MM she had an inflamed right nipple and that shortly after she secreted puss out of it. The inflammation then resolved.   PAST MEDICAL HISTORY:   Past Medical History: Past Medical History:  Diagnosis Date   Arthritis    Diabetes mellitus without complication (HCC)    GERD (gastroesophageal reflux disease)    Hypertension    Hypothyroidism     Surgical History: Past Surgical History:  Procedure Laterality Date   ABDOMINAL HYSTERECTOMY     ANTERIOR AND POSTERIOR REPAIR N/A 09/22/2017   Procedure: ANTERIOR (CYSTOCELE) AND POSTERIOR REPAIR (RECTOCELE);  Surgeon: Schermerhorn, Ihor Austin, MD;  Location: [facility];  Service: Gynecology;  Laterality: N/A;   APPENDECTOMY     BILATERAL SALPINGECTOMY Bilateral 09/22/2017   Procedure: BILATERAL SALPINGECTOMY;  Surgeon: Schermerhorn, Ihor Austin, MD;  Location: [facility];  Service: Gynecology;  Laterality: Bilateral;   BREAST LUMPECTOMY WITH RADIOFREQUENCY TAG IDENTIFICATION Left 12/13/2020   Procedure: BREAST  LUMPECTOMY WITH RADIOFREQUENCY TAG IDENTIFICATION;  Surgeon: Lucretia Roers, MD;  Location: [facility];  Service: General;  Laterality: Left;   CATARACT EXTRACTION W/PHACO Right 08/25/2020   Procedure: CATARACT EXTRACTION PHACO AND INTRAOCULAR LENS PLACEMENT RIGHT EYE;  Surgeon: Fabio Pierce, MD;  Location: [facility];  Service: Ophthalmology;  Laterality: Right;  CDE  7.01   CATARACT EXTRACTION W/PHACO Left 10/20/2020   Procedure: CATARACT EXTRACTION PHACO AND INTRAOCULAR LENS PLACEMENT (IOC);  Surgeon: Fabio Pierce, MD;  Location: [facility];  Service: Ophthalmology;  Laterality: Left;  CDE: 7.61   COLONOSCOPY WITH PROPOFOL N/A 12/16/2017   Procedure: COLONOSCOPY WITH PROPOFOL;  Surgeon: Toledo, Boykin Nearing, MD;  Location: [facility];  Service: Gastroenterology;  Laterality: N/A;  (+) DM - oral   PARTIAL  MASTECTOMY WITH AXILLARY SENTINEL LYMPH NODE BIOPSY Left 12/13/2020   Procedure: PARTIAL MASTECTOMY WITH AXILLARY SENTINEL LYMPH NODE BIOPSY;  Surgeon: Lucretia Roers, MD;  Location: [facility];  Service: General;  Laterality: Left;   TRACHEOSTOMY  2018   VAGINAL HYSTERECTOMY N/A 09/22/2017   Procedure: HYSTERECTOMY VAGINAL;  Surgeon: Suzy Bouchard, MD;  Location: [facility];  Service: Gynecology;  Laterality: N/A;    Social History: Social History   Socioeconomic History   Marital status: Single    Spouse name: Not on file   Number of children: Not on file   Years of education: Not on file   Highest education level: Not on file  Occupational History   Not on file  Tobacco Use   Smoking status: Every Day    Current packs/day: 0.50    Average packs/day: 0.5 packs/day for 39.0 years (19.5 ttl pk-yrs)    Types: Cigarettes   Smokeless tobacco: Never  Vaping Use   Vaping status: Former  Substance and Sexual Activity   Alcohol use: Yes    Alcohol/week: 7.0 standard drinks of alcohol    Types: 7 Glasses of wine per week   Drug use: No   Sexual activity: Yes  Other Topics Concern   Not  on file  Social History Narrative   Not on file   Social Determinants of Health   Financial Resource Strain: Medium Risk (01/01/2021)   Overall Financial Resource Strain (CARDIA)    Difficulty of Paying Living Expenses: Somewhat hard  Food Insecurity: No Food Insecurity (01/01/2021)   Hunger Vital Sign    Worried About Running Out of Food in the Last Year: Never true    Ran Out of Food in the Last Year: Never true  Transportation Needs: No Transportation Needs (01/01/2021)   PRAPARE - Administrator, Civil Service (Medical): No    Lack of Transportation (Non-Medical): No  Physical Activity: Inactive (01/01/2021)   Exercise Vital Sign    Days of Exercise per Week: 0 days    Minutes of Exercise per Session: 0 min  Stress: No Stress Concern Present (01/01/2021)   [employer] of Occupational Health - Occupational Stress Questionnaire    Feeling of Stress : Not at all  Social Connections: Moderately Isolated (01/01/2021)   Social Connection and Isolation Panel [NHANES]    Frequency of Communication with Friends and Family: More than three times a week    Frequency of Social Gatherings with Friends and Family: More than three times a week    Attends Religious Services: More than 4 times per year    Active Member of [employer] or Organizations: No    Attends Banker Meetings: Never    Marital Status: Never married  Intimate Partner Violence: Not At Risk (01/01/2021)   Humiliation, Afraid, Rape, and Kick questionnaire    Fear of Current or Ex-Partner: No    Emotionally Abused: No    Physically Abused: No    Sexually Abused: No    Family History: History reviewed. No pertinent family history.  Current Medications:  Current Outpatient Medications:    acetaminophen (TYLENOL) 500 MG tablet, Take 1,000 mg by mouth every 6 (six) hours as needed for moderate pain or headache., Disp: , Rfl:    albuterol (VENTOLIN HFA) 108 (90 Base) MCG/ACT inhaler, 1 puff as needed  for cough or shortness of breath, Disp: , Rfl:    anastrozole (ARIMIDEX) 1 MG tablet, TAKE 1 TABLET BY MOUTH EVERY DAY, Disp: 90  tablet, Rfl: 3   atorvastatin (LIPITOR) 20 MG tablet, Take 20 mg by mouth daily., Disp: , Rfl:    buPROPion ER (WELLBUTRIN SR) 100 MG 12 hr tablet, Take 100 mg by mouth daily., Disp: , Rfl:    gabapentin (NEURONTIN) 100 MG capsule, Take 100-200 mg by mouth 2 (two) times daily as needed (for cramping/pain.)., Disp: , Rfl:    glipiZIDE (GLUCOTROL) 5 MG tablet, TAKE 1 TABLET BY MOUTH EVERY DAY 30 MINUTES BEFORE BREAKFAST, Disp: , Rfl:    ibuprofen (ADVIL) 200 MG tablet, Take 400 mg by mouth every 6 (six) hours as needed for headache or moderate pain., Disp: , Rfl:    levothyroxine (SYNTHROID) 88 MCG tablet, Take by mouth., Disp: , Rfl:    lidocaine (XYLOCAINE) 2 % solution, Use as directed 15 mLs in the mouth or throat every 3 (three) hours as needed for mouth pain (swish and spit)., Disp: 100 mL, Rfl: 0   lisinopril (ZESTRIL) 5 MG tablet, Take 5 mg by mouth daily., Disp: , Rfl:    melatonin 5 MG TABS, Take 10-15 mg by mouth at bedtime as needed (sleep)., Disp: , Rfl:    metFORMIN (GLUCOPHAGE-XR) 500 MG 24 hr tablet, Take 500 mg by mouth 2 (two) times daily., Disp: , Rfl: 1   Semaglutide,0.25 or 0.5MG /DOS, (OZEMPIC, 0.25 OR 0.5 MG/DOSE,) 2 MG/1.5ML SOPN, Inject 0.5 mg into the skin once a week., Disp: , Rfl:    sucralfate (CARAFATE) 1 GM/10ML suspension, Take 10 mLs (1 g total) by mouth 4 (four) times daily -  with meals and at bedtime., Disp: 420 mL, Rfl: 0   varenicline (CHANTIX) 1 MG tablet, Take 1 tablet by mouth 2 (two) times daily., Disp: , Rfl:    Allergies: No Known Allergies  REVIEW OF SYSTEMS:   Review of Systems  Constitutional:  Negative for chills, fatigue and fever.  HENT:   Negative for lump/mass, mouth sores, nosebleeds, sore throat and trouble swallowing.   Eyes:  Negative for eye problems.  Respiratory:  Negative for cough and shortness of breath.    Cardiovascular:  Negative for chest pain, leg swelling and palpitations.  Gastrointestinal:  Negative for abdominal pain, constipation, diarrhea, nausea and vomiting.  Genitourinary:  Negative for bladder incontinence, difficulty urinating, dysuria, frequency, hematuria and nocturia.   Musculoskeletal:  Negative for arthralgias, back pain, flank pain, myalgias and neck pain.  Skin:  Negative for itching and rash.  Neurological:  Negative for dizziness, headaches and numbness.  Hematological:  Does not bruise/bleed easily.  Psychiatric/Behavioral:  Negative for depression, sleep disturbance and suicidal ideas. The patient is not nervous/anxious.   All other systems reviewed and are negative.    VITALS:   Blood pressure 120/79, pulse 99, temperature 97.8 F (36.6 C), temperature source Oral, resp. rate 16, height 5\' 3"  (1.6 m), weight 139 lb 1.8 oz (63.1 kg), SpO2 100%.  Wt Readings from Last 3 Encounters:  12/12/22 139 lb 1.8 oz (63.1 kg)  06/06/22 132 lb (59.9 kg)  11/29/21 136 lb (61.7 kg)    Body mass index is 24.64 kg/m.  Performance status (ECOG): 1 - Symptomatic but completely ambulatory  PHYSICAL EXAM:   Physical Exam Vitals and nursing note reviewed. Exam conducted with a chaperone present.  Constitutional:      Appearance: Normal appearance.  Cardiovascular:     Rate and Rhythm: Normal rate and regular rhythm.     Pulses: Normal pulses.     Heart sounds: Normal heart sounds.  Pulmonary:  Effort: Pulmonary effort is normal.     Breath sounds: Normal breath sounds.  Chest:     Comments:  +Left breast lumpectomy scar around the aerial is WNL Abdominal:     Palpations: Abdomen is soft. There is no hepatomegaly, splenomegaly or mass.     Tenderness: There is no abdominal tenderness.  Musculoskeletal:     Right lower leg: No edema.     Left lower leg: No edema.  Lymphadenopathy:     Cervical: No cervical adenopathy.     Right cervical: No superficial, deep or  posterior cervical adenopathy.    Left cervical: No superficial, deep or posterior cervical adenopathy.     Upper Body:     Right upper body: No supraclavicular or axillary adenopathy.     Left upper body: No supraclavicular or axillary adenopathy.  Neurological:     General: No focal deficit present.     Mental Status: She is alert and oriented to person, place, and time.  Psychiatric:        Mood and Affect: Mood normal.        Behavior: Behavior normal.     LABS:      Latest Ref Rng & Units 12/05/2022    3:13 PM 05/30/2022    2:37 PM 11/22/2021    9:47 AM  CBC  WBC 4.0 - 10.5 K/uL 9.6  5.7  6.2   Hemoglobin 12.0 - 15.0 g/dL 16.1  09.6  04.5   Hematocrit 36.0 - 46.0 % 37.3  35.4  38.4   Platelets 150 - 400 K/uL 286  227  219       Latest Ref Rng & Units 12/05/2022    3:13 PM 05/30/2022    2:37 PM 11/22/2021    9:47 AM  CMP  Glucose 70 - 99 mg/dL 409  89  98   BUN 8 - 23 mg/dL 21  16  15    Creatinine 0.44 - 1.00 mg/dL 8.11  9.14  7.82   Sodium 135 - 145 mmol/L 131  136  139   Potassium 3.5 - 5.1 mmol/L 4.1  4.2  4.0   Chloride 98 - 111 mmol/L 96  103  107   CO2 22 - 32 mmol/L 20  23  26    Calcium 8.9 - 10.3 mg/dL 9.3  9.3  9.3   Total Protein 6.5 - 8.1 g/dL 7.3  7.0  7.3   Total Bilirubin 0.3 - 1.2 mg/dL 0.2  0.4  0.6   Alkaline Phos 38 - 126 U/L 56  53  51   AST 15 - 41 U/L 14  16  13    ALT 0 - 44 U/L 17  13  12       No results found for: "CEA1", "CEA" / No results found for: "CEA1", "CEA" No results found for: "PSA1" No results found for: "NFA213" No results found for: "CAN125"  No results found for: "TOTALPROTELP", "ALBUMINELP", "A1GS", "A2GS", "BETS", "BETA2SER", "GAMS", "MSPIKE", "SPEI" No results found for: "TIBC", "FERRITIN", "IRONPCTSAT" No results found for: "LDH"   STUDIES:   No results found.

## 2022-12-12 NOTE — Patient Instructions (Addendum)
[facility] - [facility]  Discharge Instructions  You were seen and examined today by Dr. Ellin Saba.  Dr. Ellin Saba discussed your most recent lab work mammogram which revealed that everything looks good except your sugar is high.   Continue taking Anastrozole, Vitamin D and Calcium as prescribed.  Dr. Ellin Saba will repeat CT of chest, Dexa scan and labs before your next appointment.  Follow-up as scheduled in 6 months.    Thank you for choosing [facility] to provide your oncology and hematology care.   To afford each patient quality time with our provider, please arrive at least 15 minutes before your scheduled appointment time. You may need to reschedule your appointment if you arrive late (10 or more minutes). Arriving late affects you and other patients whose appointments are after yours.  Also, if you miss three or more appointments without notifying the office, you may be dismissed from the clinic at the provider's discretion.    Again, thank you for choosing [facility].  Our hope is that these requests will decrease the amount of time that you wait before being seen by our physicians.   If you have a lab appointment with the Cancer Center - please note that after April 8th, all labs will be drawn in the cancer center.  You do not have to check in or register with the main entrance as you have in the past but will complete your check-in at the cancer center.            _____________________________________________________________  Should you have questions after your visit to [facility], please contact our office at [phone] and follow the prompts.  Our office hours are 8:00 a.m. to 4:30 p.m. Monday - Thursday and 8:00 a.m. to 2:30 p.m. Friday.  Please note that voicemails left after 4:00 p.m. may not be returned until the following business day.  We are closed weekends and all major holidays.  You do have  access to a nurse 24-7, just call the main number to the clinic [phone] and do not press any options, hold on the line and a nurse will answer the phone.    For prescription refill requests, have your pharmacy contact our office and allow 72 hours.    Masks are no longer required in the cancer centers. If you would like for your care team to wear a mask while they are taking care of you, please let them know. You may have one support person who is at least 65 years old accompany you for your appointments.

## 2022-12-27 ENCOUNTER — Encounter: Payer: Self-pay | Admitting: *Deleted

## 2023-02-06 ENCOUNTER — Other Ambulatory Visit: Payer: Self-pay | Admitting: Hematology

## 2023-02-06 ENCOUNTER — Other Ambulatory Visit: Payer: Self-pay | Admitting: *Deleted

## 2023-02-06 NOTE — Telephone Encounter (Signed)
Anastrozole refill approved.  Patient is tolerating and is to continue therapy.

## 2023-02-11 ENCOUNTER — Ambulatory Visit (HOSPITAL_COMMUNITY): Payer: No Typology Code available for payment source

## 2023-02-19 ENCOUNTER — Ambulatory Visit
Admission: RE | Admit: 2023-02-19 | Discharge: 2023-02-19 | Disposition: A | Discharge status: Home / Self Care | Payer: No Typology Code available for payment source | Source: Ambulatory Visit | Attending: Hematology | Admitting: Hematology

## 2023-02-19 DIAGNOSIS — Z87891 Personal history of nicotine dependence: Secondary | ICD-10-CM

## 2023-02-19 DIAGNOSIS — Z122 Encounter for screening for malignant neoplasm of respiratory organs: Secondary | ICD-10-CM

## 2023-02-19 DIAGNOSIS — C50912 Malignant neoplasm of unspecified site of left female breast: Secondary | ICD-10-CM

## 2023-04-07 ENCOUNTER — Encounter: Payer: Self-pay | Admitting: *Deleted

## 2023-06-10 ENCOUNTER — Inpatient Hospital Stay: Payer: Medicare Other | Attending: Hematology

## 2023-06-10 DIAGNOSIS — Z79811 Long term (current) use of aromatase inhibitors: Secondary | ICD-10-CM | POA: Diagnosis not present

## 2023-06-10 DIAGNOSIS — M8580 Other specified disorders of bone density and structure, unspecified site: Secondary | ICD-10-CM | POA: Diagnosis not present

## 2023-06-10 DIAGNOSIS — C50912 Malignant neoplasm of unspecified site of left female breast: Secondary | ICD-10-CM

## 2023-06-10 DIAGNOSIS — F17210 Nicotine dependence, cigarettes, uncomplicated: Secondary | ICD-10-CM | POA: Insufficient documentation

## 2023-06-10 DIAGNOSIS — C50412 Malignant neoplasm of upper-outer quadrant of left female breast: Secondary | ICD-10-CM | POA: Diagnosis present

## 2023-06-10 DIAGNOSIS — Z170 Estrogen receptor positive status [ER+]: Secondary | ICD-10-CM | POA: Diagnosis not present

## 2023-06-10 LAB — COMPREHENSIVE METABOLIC PANEL
ALT: 14 U/L (ref 0–44)
AST: 16 U/L (ref 15–41)
Albumin: 4.2 g/dL (ref 3.5–5.0)
Alkaline Phosphatase: 51 U/L (ref 38–126)
Anion gap: 9 (ref 5–15)
BUN: 16 mg/dL (ref 8–23)
CO2: 24 mmol/L (ref 22–32)
Calcium: 9.6 mg/dL (ref 8.9–10.3)
Chloride: 104 mmol/L (ref 98–111)
Creatinine, Ser: 0.8 mg/dL (ref 0.44–1.00)
GFR, Estimated: >60 mL/min (ref >60)
Glucose, Bld: 117 mg/dL — ABNORMAL HIGH (ref 70–99)
Potassium: 3.9 mmol/L (ref 3.5–5.1)
Sodium: 137 mmol/L (ref 135–145)
Total Bilirubin: 0.3 mg/dL (ref 0.0–1.2)
Total Protein: 7 g/dL (ref 6.5–8.1)

## 2023-06-10 LAB — CBC WITH DIFFERENTIAL/PLATELET
Abs Immature Granulocytes: 0.02 10*3/uL (ref 0.00–0.07)
Basophils Absolute: 0 10*3/uL (ref 0.0–0.1)
Basophils Relative: 1 %
Eosinophils Absolute: 0.2 10*3/uL (ref 0.0–0.5)
Eosinophils Relative: 3 %
HCT: 36.4 % (ref 36.0–46.0)
Hemoglobin: 12.2 g/dL (ref 12.0–15.0)
Immature Granulocytes: 0 %
Lymphocytes Relative: 40 %
Lymphs Abs: 2.9 10*3/uL (ref 0.7–4.0)
MCH: 33.2 pg (ref 26.0–34.0)
MCHC: 33.5 g/dL (ref 30.0–36.0)
MCV: 98.9 fL (ref 80.0–100.0)
Monocytes Absolute: 0.5 10*3/uL (ref 0.1–1.0)
Monocytes Relative: 7 %
Neutro Abs: 3.6 10*3/uL (ref 1.7–7.7)
Neutrophils Relative %: 49 %
Platelets: 237 10*3/uL (ref 150–400)
RBC: 3.68 MIL/uL — ABNORMAL LOW (ref 3.87–5.11)
RDW: 12.3 % (ref 11.5–15.5)
WBC: 7.3 10*3/uL (ref 4.0–10.5)
nRBC: 0 % (ref 0.0–0.2)

## 2023-06-10 LAB — VITAMIN D 25 HYDROXY (VIT D DEFICIENCY, FRACTURES): Vit D, 25-Hydroxy: 33.46 ng/mL (ref 30–100)

## 2023-06-11 ENCOUNTER — Inpatient Hospital Stay: Payer: Self-pay

## 2023-06-11 ENCOUNTER — Inpatient Hospital Stay (HOSPITAL_COMMUNITY): Admission: RE | Admit: 2023-06-11 | Payer: No Typology Code available for payment source | Source: Ambulatory Visit

## 2023-06-16 ENCOUNTER — Inpatient Hospital Stay: Payer: Medicare Other | Admitting: Hematology

## 2023-06-16 ENCOUNTER — Other Ambulatory Visit (HOSPITAL_COMMUNITY): Payer: Self-pay | Admitting: Hematology

## 2023-06-16 VITALS — BP 100/66 | HR 85 | Temp 97.8°F | Resp 18 | Wt 138.9 lb

## 2023-06-16 DIAGNOSIS — C50412 Malignant neoplasm of upper-outer quadrant of left female breast: Secondary | ICD-10-CM | POA: Diagnosis not present

## 2023-06-16 DIAGNOSIS — Z853 Personal history of malignant neoplasm of breast: Secondary | ICD-10-CM

## 2023-06-16 DIAGNOSIS — Z122 Encounter for screening for malignant neoplasm of respiratory organs: Secondary | ICD-10-CM | POA: Diagnosis not present

## 2023-06-16 DIAGNOSIS — C50912 Malignant neoplasm of unspecified site of left female breast: Secondary | ICD-10-CM

## 2023-06-16 NOTE — Patient Instructions (Addendum)
 [facility] at [facility] : [facility] Discharge Instructions   You were seen and examined today by Dr. Ellin Saba.  He reviewed the results of your lab work which are normal/stable.   Continue anastrozole as prescribed.   Continue taking calcium and vitamin D.   We will see you back in 6 months. Your mammogram will be repeated in July.    Return as scheduled.    Thank you for choosing [facility] at [facility] to provide your oncology and hematology care.  To afford each patient quality time with our provider, please arrive at least 15 minutes before your scheduled appointment time.   If you have a lab appointment with the Cancer Center please come in thru the Main Entrance and check in at the main information desk.  You need to re-schedule your appointment should you arrive 10 or more minutes late.  We strive to give you quality time with our providers, and arriving late affects you and other patients whose appointments are after yours.  Also, if you no show three or more times for appointments you may be dismissed from the clinic at the providers discretion.     Again, thank you for choosing [facility].  Our hope is that these requests will decrease the amount of time that you wait before being seen by our physicians.       _____________________________________________________________  Should you have questions after your visit to [facility], please contact our office at [phone] and follow the prompts.  Our office hours are 8:00 a.m. and 4:30 p.m. Monday - Friday.  Please note that voicemails left after 4:00 p.m. may not be returned until the following business day.  We are closed weekends and major holidays.  You do have access to a nurse 24-7, just call the main number to the clinic [phone] and do not press any options, hold on the line and a nurse will answer the phone.    For prescription refill requests, have  your pharmacy contact our office and allow 72 hours.    Due to Covid, you will need to wear a mask upon entering the hospital. If you do not have a mask, a mask will be given to you at the Main Entrance upon arrival. For doctor visits, patients may have 1 support person age 68 or older with them. For treatment visits, patients can not have anyone with them due to social distancing guidelines and our immunocompromised population.

## 2023-06-16 NOTE — Progress Notes (Signed)
 [facility] [address] [place] [address]    Clinic Day:  06/16/2023  Referring physician: Waldon Reining, MD  Patient Care Team: Bucio, Julian Reil, FNP as PCP - General (Family Medicine) Therese Sarah, RN as Oncology Nurse Navigator (Oncology) Doreatha Massed, MD as Medical Oncologist (Oncology)   ASSESSMENT & PLAN:   Assessment: Stage I (PT1CPN0) left breast IDC: - Left breast 12:30 o'clock biopsy consistent with IDC, ER/PR positive, HER2 negative by FISH - Lumpectomy and SLNB on 12/13/2020, pathology with 1.3 cm low-grade, margins negative, ER/PR positive, HER2 negative, Ki-67 5%.  0/2 lymph nodes on SLNB. - Oncotype DX recurrence score 19.  Distant recurrence risk at 9 years with AI/tamoxifen alone was 6%.  Absolute chemotherapy benefit was less than 1%. - Anastrozole 1 mg daily started on 01/31/2021. - XRT to left breast from 02/01/2021 through 03/05/2021.  2.  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 - Brother had prostate cancer.  Father had cancer, type unknown to the patient.    Plan: Stage I left breast IDC, ER/PR positive, HER2 negative: - She is tolerating anastrozole reasonably well. - Physical exam today: Lumpectomy scar around the left areola within normal limits.  Left breast lateral quadrant tenderness is stable.  No palpable masses or adenopathy. - Mammogram on 11/05/2022: Within normal limits. - Reviewed labs from 06/10/2023: Normal LFTs and CBC. - Continue anastrozole daily.  RTC 6 months for follow-up.  Will arrange for diagnostic mammogram in July.  2.  Osteopenia (DEXA 01/10/2021 T-score -1.4): - Continue calcium and vitamin D supplements.  Vitamin D level is normal at 33. - Will arrange for DEXA scan prior to next visit.  3.  Tobacco abuse: - She continues to smoke half pack of cigarettes per day. - Reviewed CT chest lung cancer scan from 02/19/2023: Lung  RADS 2S.  Other benign findings discussed.  Will continue yearly monitoring.   Breast Cancer therapy associated bone loss: I have recommended calcium, Vitamin D and weight bearing exercises.  Orders Placed This Encounter  Procedures   MM DIAG BREAST TOMO BILATERAL    Standing Status:   Future    Expected Date:   11/12/2023    Expiration Date:   06/15/2024    Reason for Exam (SYMPTOM  OR DIAGNOSIS REQUIRED):   breast cancer screening    Preferred imaging location?:   [facility]   CBC with Differential    Standing Status:   Future    Expected Date:   12/22/2023    Expiration Date:   06/15/2024   Comprehensive metabolic panel    Standing Status:   Future    Expected Date:   12/22/2023    Expiration Date:   06/15/2024      Mikeal Hawthorne R Teague,acting as a scribe for Doreatha Massed, MD.,have documented all relevant documentation on the behalf of Doreatha Massed, MD,as directed by  Doreatha Massed, MD while in the presence of Doreatha Massed, MD.  I, Doreatha Massed MD, have reviewed the above documentation for accuracy and completeness, and I agree with the above.     Doreatha Massed, MD   2/24/20254:29 PM  CHIEF COMPLAINT:   Diagnosis: left breast cancer     Cancer Staging  Invasive ductal carcinoma of left breast [facility]) Staging form: Breast, AJCC 8th Edition - Clinical stage from 01/01/2021: Stage IA (cT1c, cN0, cM0, G1, ER+, PR+, HER2-) - Unsigned  Prior Therapy: Lumpectomy and SLNB followed by XRT  Current Therapy: Anastrozole   HISTORY OF PRESENT ILLNESS:   Oncology History  Invasive ductal carcinoma of left breast (HCC)  01/09/2021 Genetic Testing   Oncotype DX:         INTERVAL HISTORY:   Denise Sanders is a 68 y.o. female presenting to clinic today for follow up of left breast cancer. She was last seen by me on 12/12/22.  Since her last visit, she underwent CT chest lung cancer screening on 02/19/23 that found: Lung-RADS 2S, benign  appearance or behavior. Mild diffuse bronchial wall thickening with very mild centrilobular and paraseptal emphysema; imaging findings suggestive of underlying COPD.  Today, she states that she is doing well overall. Her appetite level is at 100%. Her energy level is at 75%.  PAST MEDICAL HISTORY:   Past Medical History: Past Medical History:  Diagnosis Date   Arthritis    Diabetes mellitus without complication (HCC)    GERD (gastroesophageal reflux disease)    Hypertension    Hypothyroidism     Surgical History: Past Surgical History:  Procedure Laterality Date   ABDOMINAL HYSTERECTOMY     ANTERIOR AND POSTERIOR REPAIR N/A 09/22/2017   Procedure: ANTERIOR (CYSTOCELE) AND POSTERIOR REPAIR (RECTOCELE);  Surgeon: Schermerhorn, Ihor Austin, MD;  Location: [facility] ORS;  Service: Gynecology;  Laterality: N/A;   APPENDECTOMY     BILATERAL SALPINGECTOMY Bilateral 09/22/2017   Procedure: BILATERAL SALPINGECTOMY;  Surgeon: Schermerhorn, Ihor Austin, MD;  Location: [facility] OR[facility];  Service: Gynecology;  Laterality: Bilateral;   BREAST LUMPECTOMY WITH RADIOFREQUENCY TAG IDENTIFICATION Left 12/13/2020   Procedure: BREAST LUMPECTOMY WITH RADIOFREQUENCY TAG IDENTIFICATION;  Surgeon: Lucretia Roers, MD;  Location: [facility];  Service: General;  Laterality: Left;   CATARACT EXTRACTION W/PHACO Right 08/25/2020   Procedure: CATARACT EXTRACTION PHACO AND INTRAOCULAR LENS PLACEMENT RIGHT EYE;  Surgeon: Fabio Pierce, MD;  Location: [facility];  Service: Ophthalmology;  Laterality: Right;  CDE  7.01   CATARACT EXTRACTION W/PHACO Left 10/20/2020   Procedure: CATARACT EXTRACTION PHACO AND INTRAOCULAR LENS PLACEMENT (IOC);  Surgeon: Fabio Pierce, MD;  Location: [facility];  Service: Ophthalmology;  Laterality: Left;  CDE: 7.61   COLONOSCOPY WITH PROPOFOL N/A 12/16/2017   Procedure: COLONOSCOPY WITH PROPOFOL;  Surgeon: Toledo, Boykin Nearing, MD;  Location: [facility];  Service: Gastroenterology;  Laterality: N/A;  (+) DM - oral    PARTIAL MASTECTOMY WITH AXILLARY SENTINEL LYMPH NODE BIOPSY Left 12/13/2020   Procedure: PARTIAL MASTECTOMY WITH AXILLARY SENTINEL LYMPH NODE BIOPSY;  Surgeon: Lucretia Roers, MD;  Location: [facility];  Service: General;  Laterality: Left;   TRACHEOSTOMY  2018   VAGINAL HYSTERECTOMY N/A 09/22/2017   Procedure: HYSTERECTOMY VAGINAL;  Surgeon: Suzy Bouchard, MD;  Location: [facility] OR[facility];  Service: Gynecology;  Laterality: N/A;    Social History: Social History   Socioeconomic History   Marital status: Single    Spouse name: Not on file   Number of children: Not on file   Years of education: Not on file   Highest education level: Not on file  Occupational History   Not on file  Tobacco Use   Smoking status: Every Day    Current packs/day: 0.50    Average packs/day: 0.5 packs/day for 39.0 years (19.5 ttl pk-yrs)    Types: Cigarettes   Smokeless tobacco: Never  Vaping Use   Vaping status: Former  Substance and Sexual Activity   Alcohol use: Yes    Alcohol/week: 7.0 standard drinks of alcohol  Types: 7 Glasses of wine per week   Drug use: No   Sexual activity: Yes  Other Topics Concern   Not on file  Social History Narrative   Not on file   Social Drivers of Health   Financial Resource Strain: Medium Risk (01/01/2021)   Overall Financial Resource Strain (CARDIA)    Difficulty of Paying Living Expenses: Somewhat hard  Food Insecurity: No Food Insecurity (01/01/2021)   Hunger Vital Sign    Worried About Running Out of Food in the Last Year: Never true    Ran Out of Food in the Last Year: Never true  Transportation Needs: No Transportation Needs (01/01/2021)   PRAPARE - Administrator, Civil Service (Medical): No    Lack of Transportation (Non-Medical): No  Physical Activity: Inactive (01/01/2021)   Exercise Vital Sign    Days of Exercise per Week: 0 days    Minutes of Exercise per Session: 0 min  Stress: No Stress Concern Present (01/01/2021)   [employer] of Occupational Health - Occupational Stress Questionnaire    Feeling of Stress : Not at all  Social Connections: Moderately Isolated (01/01/2021)   Social Connection and Isolation Panel [NHANES]    Frequency of Communication with Friends and Family: More than three times a week    Frequency of Social Gatherings with Friends and Family: More than three times a week    Attends Religious Services: More than 4 times per year    Active Member of [employer] or Organizations: No    Attends Banker Meetings: Never    Marital Status: Never married  Intimate Partner Violence: Not At Risk (01/01/2021)   Humiliation, Afraid, Rape, and Kick questionnaire    Fear of Current or Ex-Partner: No    Emotionally Abused: No    Physically Abused: No    Sexually Abused: No    Family History: No family history on file.  Current Medications:  Current Outpatient Medications:    acetaminophen (TYLENOL) 500 MG tablet, Take 1,000 mg by mouth every 6 (six) hours as needed for moderate pain or headache., Disp: , Rfl:    albuterol (VENTOLIN HFA) 108 (90 Base) MCG/ACT inhaler, 1 puff as needed for cough or shortness of breath, Disp: , Rfl:    anastrozole (ARIMIDEX) 1 MG tablet, TAKE 1 TABLET BY MOUTH EVERY DAY, Disp: 90 tablet, Rfl: 3   atorvastatin (LIPITOR) 20 MG tablet, Take 20 mg by mouth daily., Disp: , Rfl:    gabapentin (NEURONTIN) 100 MG capsule, Take 100-200 mg by mouth 2 (two) times daily as needed (for cramping/pain.)., Disp: , Rfl:    glipiZIDE (GLUCOTROL) 5 MG tablet, TAKE 1 TABLET BY MOUTH EVERY DAY 30 MINUTES BEFORE BREAKFAST, Disp: , Rfl:    ibuprofen (ADVIL) 200 MG tablet, Take 400 mg by mouth every 6 (six) hours as needed for headache or moderate pain., Disp: , Rfl:    levothyroxine (SYNTHROID) 88 MCG tablet, Take by mouth., Disp: , Rfl:    lisinopril (ZESTRIL) 5 MG tablet, Take 5 mg by mouth daily., Disp: , Rfl:    melatonin 5 MG TABS, Take 10-15 mg by mouth at bedtime as needed  (sleep)., Disp: , Rfl:    metFORMIN (GLUCOPHAGE-XR) 500 MG 24 hr tablet, Take 500 mg by mouth 2 (two) times daily., Disp: , Rfl: 1   Semaglutide,0.25 or 0.5MG /DOS, (OZEMPIC, 0.25 OR 0.5 MG/DOSE,) 2 MG/1.5ML SOPN, Inject 0.5 mg into the skin once a week., Disp: , Rfl:  varenicline (CHANTIX) 1 MG tablet, Take 1 tablet by mouth 2 (two) times daily., Disp: , Rfl:    Allergies: No Known Allergies  REVIEW OF SYSTEMS:   Review of Systems  Constitutional:  Negative for chills, fatigue and fever.  HENT:   Negative for lump/mass, mouth sores, nosebleeds, sore throat and trouble swallowing.   Eyes:  Negative for eye problems.  Respiratory:  Negative for cough and shortness of breath.   Cardiovascular:  Negative for chest pain, leg swelling and palpitations.  Gastrointestinal:  Positive for constipation. Negative for abdominal pain, diarrhea, nausea and vomiting.  Genitourinary:  Negative for bladder incontinence, difficulty urinating, dysuria, frequency, hematuria and nocturia.   Musculoskeletal:  Negative for arthralgias, back pain, flank pain, myalgias and neck pain.  Skin:  Negative for itching and rash.  Neurological:  Positive for dizziness. Negative for headaches and numbness.  Hematological:  Does not bruise/bleed easily.  Psychiatric/Behavioral:  Negative for depression, sleep disturbance and suicidal ideas. The patient is not nervous/anxious.   All other systems reviewed and are negative.    VITALS:   Blood pressure 100/66, pulse 85, temperature 97.8 F (36.6 C), temperature source Tympanic, resp. rate 18, weight 138 lb 14.2 oz (63 kg), SpO2 100%.  Wt Readings from Last 3 Encounters:  06/16/23 138 lb 14.2 oz (63 kg)  12/12/22 139 lb 1.8 oz (63.1 kg)  06/06/22 132 lb (59.9 kg)    Body mass index is 24.6 kg/m.  Performance status (ECOG): 1 - Symptomatic but completely ambulatory  PHYSICAL EXAM:   Physical Exam Vitals and nursing note reviewed. Exam conducted with a chaperone  present.  Constitutional:      Appearance: Normal appearance.  Cardiovascular:     Rate and Rhythm: Normal rate and regular rhythm.     Pulses: Normal pulses.     Heart sounds: Normal heart sounds.  Pulmonary:     Effort: Pulmonary effort is normal.     Breath sounds: Normal breath sounds.  Abdominal:     Palpations: Abdomen is soft. There is no hepatomegaly, splenomegaly or mass.     Tenderness: There is no abdominal tenderness.  Musculoskeletal:     Right lower leg: No edema.     Left lower leg: No edema.  Lymphadenopathy:     Cervical: No cervical adenopathy.     Right cervical: No superficial, deep or posterior cervical adenopathy.    Left cervical: No superficial, deep or posterior cervical adenopathy.     Upper Body:     Right upper body: No supraclavicular or axillary adenopathy.     Left upper body: No supraclavicular or axillary adenopathy.  Neurological:     General: No focal deficit present.     Mental Status: She is alert and oriented to person, place, and time.  Psychiatric:        Mood and Affect: Mood normal.        Behavior: Behavior normal.     LABS:      Latest Ref Rng & Units 06/10/2023    3:25 PM 12/05/2022    3:13 PM 05/30/2022    2:37 PM  CBC  WBC 4.0 - 10.5 K/uL 7.3  9.6  5.7   Hemoglobin 12.0 - 15.0 g/dL 16.1  09.6  04.5   Hematocrit 36.0 - 46.0 % 36.4  37.3  35.4   Platelets 150 - 400 K/uL 237  286  227       Latest Ref Rng & Units 06/10/2023    3:25  PM 12/05/2022    3:13 PM 05/30/2022    2:37 PM  CMP  Glucose 70 - 99 mg/dL 098  119  89   BUN 8 - 23 mg/dL 16  21  16    Creatinine 0.44 - 1.00 mg/dL 1.47  8.29  5.62   Sodium 135 - 145 mmol/L 137  131  136   Potassium 3.5 - 5.1 mmol/L 3.9  4.1  4.2   Chloride 98 - 111 mmol/L 104  96  103   CO2 22 - 32 mmol/L 24  20  23    Calcium 8.9 - 10.3 mg/dL 9.6  9.3  9.3   Total Protein 6.5 - 8.1 g/dL 7.0  7.3  7.0   Total Bilirubin 0.0 - 1.2 mg/dL 0.3  0.2  0.4   Alkaline Phos 38 - 126 U/L 51  56  53    AST 15 - 41 U/L 16  14  16    ALT 0 - 44 U/L 14  17  13       No results found for: "CEA1", "CEA" / No results found for: "CEA1", "CEA" No results found for: "PSA1" No results found for: "ZHY865" No results found for: "CAN125"  No results found for: "TOTALPROTELP", "ALBUMINELP", "A1GS", "A2GS", "BETS", "BETA2SER", "GAMS", "MSPIKE", "SPEI" No results found for: "TIBC", "FERRITIN", "IRONPCTSAT" No results found for: "LDH"   STUDIES:   No results found.

## 2023-06-19 ENCOUNTER — Ambulatory Visit (HOSPITAL_COMMUNITY)
Admission: RE | Admit: 2023-06-19 | Discharge: 2023-06-19 | Disposition: A | Discharge status: Home / Self Care | Payer: Medicare Other | Source: Ambulatory Visit | Attending: Hematology | Admitting: Hematology

## 2023-06-19 DIAGNOSIS — Z780 Asymptomatic menopausal state: Secondary | ICD-10-CM | POA: Diagnosis not present

## 2023-06-19 DIAGNOSIS — M85851 Other specified disorders of bone density and structure, right thigh: Secondary | ICD-10-CM | POA: Diagnosis not present

## 2023-06-19 DIAGNOSIS — Z13820 Encounter for screening for osteoporosis: Secondary | ICD-10-CM | POA: Insufficient documentation

## 2023-06-19 DIAGNOSIS — Z853 Personal history of malignant neoplasm of breast: Secondary | ICD-10-CM | POA: Diagnosis not present

## 2023-06-19 DIAGNOSIS — Z87891 Personal history of nicotine dependence: Secondary | ICD-10-CM | POA: Diagnosis not present

## 2023-06-19 DIAGNOSIS — C50912 Malignant neoplasm of unspecified site of left female breast: Secondary | ICD-10-CM

## 2023-10-07 ENCOUNTER — Encounter (HOSPITAL_COMMUNITY): Payer: Medicare Other

## 2023-10-15 ENCOUNTER — Encounter (INDEPENDENT_AMBULATORY_CARE_PROVIDER_SITE_OTHER): Payer: Self-pay | Admitting: *Deleted

## 2023-11-11 ENCOUNTER — Ambulatory Visit (HOSPITAL_COMMUNITY)
Admission: RE | Admit: 2023-11-11 | Discharge: 2023-11-11 | Disposition: A | Discharge status: Home / Self Care | Payer: Medicare Other | Source: Ambulatory Visit | Attending: Hematology | Admitting: Hematology

## 2023-11-11 ENCOUNTER — Ambulatory Visit (HOSPITAL_COMMUNITY)
Admission: RE | Admit: 2023-11-11 | Discharge: 2023-11-11 | Discharge status: Home / Self Care | Payer: Medicare Other | Source: Ambulatory Visit | Attending: Hematology | Admitting: Hematology

## 2023-11-11 ENCOUNTER — Encounter (HOSPITAL_COMMUNITY): Payer: Self-pay

## 2023-11-11 DIAGNOSIS — Z122 Encounter for screening for malignant neoplasm of respiratory organs: Secondary | ICD-10-CM | POA: Insufficient documentation

## 2023-11-11 DIAGNOSIS — Z853 Personal history of malignant neoplasm of breast: Secondary | ICD-10-CM | POA: Insufficient documentation

## 2023-12-08 ENCOUNTER — Inpatient Hospital Stay: Payer: Medicare Other | Attending: Oncology

## 2023-12-08 DIAGNOSIS — C50812 Malignant neoplasm of overlapping sites of left female breast: Secondary | ICD-10-CM | POA: Insufficient documentation

## 2023-12-08 DIAGNOSIS — C50912 Malignant neoplasm of unspecified site of left female breast: Secondary | ICD-10-CM

## 2023-12-08 DIAGNOSIS — D649 Anemia, unspecified: Secondary | ICD-10-CM | POA: Insufficient documentation

## 2023-12-08 DIAGNOSIS — R059 Cough, unspecified: Secondary | ICD-10-CM | POA: Diagnosis not present

## 2023-12-08 DIAGNOSIS — Z79811 Long term (current) use of aromatase inhibitors: Secondary | ICD-10-CM | POA: Diagnosis not present

## 2023-12-08 DIAGNOSIS — Z17411 Hormone receptor positive with human epidermal growth factor receptor 2 negative status: Secondary | ICD-10-CM | POA: Insufficient documentation

## 2023-12-08 DIAGNOSIS — F17210 Nicotine dependence, cigarettes, uncomplicated: Secondary | ICD-10-CM | POA: Diagnosis not present

## 2023-12-08 DIAGNOSIS — M8580 Other specified disorders of bone density and structure, unspecified site: Secondary | ICD-10-CM | POA: Insufficient documentation

## 2023-12-08 LAB — CBC WITH DIFFERENTIAL/PLATELET
Abs Immature Granulocytes: 0.02 K/uL (ref 0.00–0.07)
Basophils Absolute: 0 K/uL (ref 0.0–0.1)
Basophils Relative: 1 %
Eosinophils Absolute: 0.1 K/uL (ref 0.0–0.5)
Eosinophils Relative: 1 %
HCT: 35.7 % — ABNORMAL LOW (ref 36.0–46.0)
Hemoglobin: 11.8 g/dL — ABNORMAL LOW (ref 12.0–15.0)
Immature Granulocytes: 0 %
Lymphocytes Relative: 33 %
Lymphs Abs: 2.7 K/uL (ref 0.7–4.0)
MCH: 32 pg (ref 26.0–34.0)
MCHC: 33.1 g/dL (ref 30.0–36.0)
MCV: 96.7 fL (ref 80.0–100.0)
Monocytes Absolute: 0.6 K/uL (ref 0.1–1.0)
Monocytes Relative: 7 %
Neutro Abs: 4.8 K/uL (ref 1.7–7.7)
Neutrophils Relative %: 58 %
Platelets: 237 K/uL (ref 150–400)
RBC: 3.69 MIL/uL — ABNORMAL LOW (ref 3.87–5.11)
RDW: 12 % (ref 11.5–15.5)
WBC: 8.3 K/uL (ref 4.0–10.5)
nRBC: 0 % (ref 0.0–0.2)

## 2023-12-08 LAB — COMPREHENSIVE METABOLIC PANEL WITH GFR
ALT: 14 U/L (ref 0–44)
AST: 20 U/L (ref 15–41)
Albumin: 3.7 g/dL (ref 3.5–5.0)
Alkaline Phosphatase: 62 U/L (ref 38–126)
Anion gap: 12 (ref 5–15)
BUN: 15 mg/dL (ref 8–23)
CO2: 22 mmol/L (ref 22–32)
Calcium: 9.2 mg/dL (ref 8.9–10.3)
Chloride: 103 mmol/L (ref 98–111)
Creatinine, Ser: 0.93 mg/dL (ref 0.44–1.00)
GFR, Estimated: >60 mL/min (ref >60)
Glucose, Bld: 217 mg/dL — ABNORMAL HIGH (ref 70–99)
Potassium: 4 mmol/L (ref 3.5–5.1)
Sodium: 137 mmol/L (ref 135–145)
Total Bilirubin: 0.6 mg/dL (ref 0.0–1.2)
Total Protein: 6.8 g/dL (ref 6.5–8.1)

## 2023-12-12 ENCOUNTER — Inpatient Hospital Stay: Admitting: Oncology

## 2023-12-12 VITALS — BP 128/84 | HR 79 | Temp 98.1°F | Resp 16 | Wt 142.0 lb

## 2023-12-12 DIAGNOSIS — C50812 Malignant neoplasm of overlapping sites of left female breast: Secondary | ICD-10-CM | POA: Diagnosis not present

## 2023-12-12 DIAGNOSIS — D649 Anemia, unspecified: Secondary | ICD-10-CM

## 2023-12-12 DIAGNOSIS — C50912 Malignant neoplasm of unspecified site of left female breast: Secondary | ICD-10-CM | POA: Diagnosis not present

## 2023-12-12 DIAGNOSIS — Z1383 Encounter for screening for respiratory disorder NEC: Secondary | ICD-10-CM

## 2023-12-12 DIAGNOSIS — M8580 Other specified disorders of bone density and structure, unspecified site: Secondary | ICD-10-CM

## 2023-12-12 DIAGNOSIS — R0789 Other chest pain: Secondary | ICD-10-CM

## 2023-12-12 NOTE — Assessment & Plan Note (Addendum)
-   Continue calcium and vitamin D  supplements.  Vitamin D  level is normal at 33. - Bone density from 06/19/2023 -1.5 which is considered osteopenic. -Recommend weightbearing exercises.  Recheck in 2 years.

## 2023-12-12 NOTE — Progress Notes (Signed)
 [facility] OFFICE PROGRESS NOTE  Bucio, Silvio BROCKS, FNP  ASSESSMENT & PLAN:    Assessment & Plan Invasive ductal carcinoma of left breast (HCC) - She is tolerating anastrozole  reasonably well. - Physical exam today: Lumpectomy scar around the left areola within normal limits.  Left breast lateral quadrant tenderness is stable.  No palpable masses or adenopathy. - Mammogram on 11/11/2023: Within normal limits. - Reviewed labs from 12/08/2023: Normal LFTs and CBC. - Continue anastrozole  daily.  RTC 6 months for follow-up.  Osteopenia, unspecified location - Continue calcium and vitamin D  supplements.  Vitamin D  level is normal at 33. - Bone density from 06/19/2023 -1.5 which is considered osteopenic. -Recommend weightbearing exercises.  Recheck in 2 years. Encounter for screening for respiratory disorder - She continues to smoke half pack of cigarettes per day. - Reviewed CT chest lung cancer scan from 02/19/2023: Lung RADS 2S.  Other benign findings discussed.  Will continue yearly monitoring. Anemia, unspecified type We discussed adding on nutritional labs at her next visit in the meantime, she will add B12 1000 mcg OTC along with her vitamin D  and calcium tablet.  She denies bleeding, bright red blood per rectum or melena.  She did have a colonoscopy back in 2019 which showed diverticuli, 3 mm polyp and nonbleeding internal hemorrhoids.  They recommended repeating in 5 years that she is currently past due. Left-sided chest wall pain Examined the patient.  There are several what feels like fibrocystic tissue below her left axilla and node biopsy site.  It is mildly tender with the touch.  I correlated this back to her most recent mammogram and they also mention it.  We discussed continuing to monitor right now and if symptoms become more persistent or worse, we can get an axillary ultrasound.  Orders Placed This Encounter  Procedures   CT CHEST LUNG CA SCREEN LOW DOSE W/O CM     AETNA Epic  Wt: 135 aware of $75 of no show fee No implants No spinal Stimulator/ No body injector/ No glucose or heart monitor No  needs Spoke w Patient [employer]    Standing Status:   Future    Expected Date:   02/11/2024    Expiration Date:   12/11/2024    Preferred Imaging Location?:   [facility]    Release to patient:   Immediate [1]   CBC with Differential    Standing Status:   Future    Expected Date:   06/04/2024    Expiration Date:   12/11/2024   Comprehensive metabolic panel    Standing Status:   Future    Expected Date:   06/04/2024    Expiration Date:   12/11/2024   Ferritin    Standing Status:   Future    Expected Date:   06/13/2024    Expiration Date:   09/11/2024   Copper, serum    Standing Status:   Future    Expected Date:   06/13/2024    Expiration Date:   09/11/2024   Vitamin B12    Standing Status:   Future    Expected Date:   06/13/2024    Expiration Date:   09/11/2024   Methylmalonic acid, serum    Standing Status:   Future    Expected Date:   06/13/2024    Expiration Date:   09/11/2024   Iron and TIBC    Standing Status:   Future    Expected Date:   06/13/2024  Expiration Date:   09/11/2024   Folate    Standing Status:   Future    Expected Date:   06/13/2024    Expiration Date:   09/11/2024    INTERVAL HISTORY: Patient returns for follow-up.  Reports overall she is feeling well.  Appetite and energy levels are 100%.  She is having pain in the left side of her chest under her axilla rating it a 5 out of 10.  It comes and goes.  Denies injury.  Has concern it might be her long.  She recently had a mammogram and it was WNL.  She continues to smoke cigarettes and she is due for a repeat CT scan in a couple of months.  Reports she has a cough secondary to smoking.  Otherwise she is feeling okay.  She has been compliant with her anastrozole , calcium and vitamin D .  She does not take anything else OTC.  We reviewed mammogram, CBC, CMP.  SUMMARY OF  HEMATOLOGIC HISTORY: Oncology History  Invasive ductal carcinoma of left breast (HCC)  01/09/2021 Genetic Testing   Oncotype DX:        Stage I (PT1CPN0) left breast IDC: - Left breast 12:30 o'clock biopsy consistent with IDC, ER/PR positive, HER2 negative by FISH - Lumpectomy and SLNB on 12/13/2020, pathology with 1.3 cm low-grade, margins negative, ER/PR positive, HER2 negative, Ki-67 5%.  0/2 lymph nodes on SLNB. - Oncotype DX recurrence score 19.  Distant recurrence risk at 9 years with AI/tamoxifen alone was 6%.  Absolute chemotherapy benefit was less than 1%. - Anastrozole  1 mg daily started on 01/31/2021. - XRT to left breast from 02/01/2021 through 03/05/2021.  2.  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 - Brother had prostate cancer.  Father had cancer, type unknown to the patient.  CBC    Component Value Date/Time   WBC 8.3 12/08/2023 1455   RBC 3.69 (L) 12/08/2023 1455   HGB 11.8 (L) 12/08/2023 1455   HCT 35.7 (L) 12/08/2023 1455   PLT 237 12/08/2023 1455   MCV 96.7 12/08/2023 1455   MCH 32.0 12/08/2023 1455   MCHC 33.1 12/08/2023 1455   RDW 12.0 12/08/2023 1455   LYMPHSABS 2.7 12/08/2023 1455   MONOABS 0.6 12/08/2023 1455   EOSABS 0.1 12/08/2023 1455   BASOSABS 0.0 12/08/2023 1455       Latest Ref Rng & Units 12/08/2023    2:55 PM 06/10/2023    3:25 PM 12/05/2022    3:13 PM  CMP  Glucose 70 - 99 mg/dL 782  882  576   BUN 8 - 23 mg/dL 15  16  21    Creatinine 0.44 - 1.00 mg/dL 9.06  9.19  9.04   Sodium 135 - 145 mmol/L 137  137  131   Potassium 3.5 - 5.1 mmol/L 4.0  3.9  4.1   Chloride 98 - 111 mmol/L 103  104  96   CO2 22 - 32 mmol/L 22  24  20    Calcium 8.9 - 10.3 mg/dL 9.2  9.6  9.3   Total Protein 6.5 - 8.1 g/dL 6.8  7.0  7.3   Total Bilirubin 0.0 - 1.2 mg/dL 0.6  0.3  0.2   Alkaline Phos 38 - 126 U/L 62  51  56   AST 15 - 41 U/L 20  16  14    ALT 0 - 44 U/L 14  14  17  No results  found for: FERRITIN, VITAMINB12  Vitals:   12/12/23 1147  BP: 128/84  Pulse: 79  Resp: 16  Temp: 98.1 F (36.7 C)  SpO2: 99%    Review of System:  Review of Systems  Respiratory:  Positive for cough.   Musculoskeletal:        Pain to left chest under left axilla.    Physical Exam: Physical Exam Constitutional:      Appearance: Normal appearance.  HENT:     Head: Normocephalic and atraumatic.  Eyes:     Pupils: Pupils are equal, round, and reactive to light.  Cardiovascular:     Rate and Rhythm: Normal rate and regular rhythm.     Heart sounds: Normal heart sounds. No murmur heard. Pulmonary:     Effort: Pulmonary effort is normal.     Breath sounds: Normal breath sounds. No wheezing.  Chest:  Breasts:    Right: Normal.     Left: Normal.     Comments: Bilateral breasts out mass.  Directly below left lymph node biopsy site, there are possibly some fibrocystic tissue present.  It is tender with palpation.  Correlated to most recent diagnostic mammogram and they also mention this. Abdominal:     General: Bowel sounds are normal. There is no distension.     Palpations: Abdomen is soft.     Tenderness: There is no abdominal tenderness.  Musculoskeletal:        General: Normal range of motion.     Cervical back: Normal range of motion.  Lymphadenopathy:     Cervical:     Right cervical: No superficial cervical adenopathy.    Left cervical: No superficial cervical adenopathy.     Upper Body:     Right upper body: No supraclavicular or axillary adenopathy.     Left upper body: No supraclavicular or axillary adenopathy.  Skin:    General: Skin is warm and dry.     Findings: No rash.  Neurological:     Mental Status: She is alert and oriented to person, place, and time.     Gait: Gait is intact.  Psychiatric:        Mood and Affect: Mood and affect normal.        Cognition and Memory: Memory normal.        Judgment: Judgment normal.      I spent 25 minutes  dedicated to the care of this patient (face-to-face and non-face-to-face) on the date of the encounter to include what is described in the assessment and plan.,  Delon Hope, NP 12/12/2023 12:52 PM

## 2023-12-12 NOTE — Assessment & Plan Note (Addendum)
-   She continues to smoke half pack of cigarettes per day. - Reviewed CT chest lung cancer scan from 02/19/2023: Lung RADS 2S.  Other benign findings discussed.  Will continue yearly monitoring.

## 2023-12-12 NOTE — Assessment & Plan Note (Addendum)
-   She is tolerating anastrozole  reasonably well. - Physical exam today: Lumpectomy scar around the left areola within normal limits.  Left breast lateral quadrant tenderness is stable.  No palpable masses or adenopathy. - Mammogram on 11/11/2023: Within normal limits. - Reviewed labs from 12/08/2023: Normal LFTs and CBC. - Continue anastrozole  daily.  RTC 6 months for follow-up.

## 2023-12-15 ENCOUNTER — Ambulatory Visit: Payer: Medicare Other | Admitting: Hematology

## 2024-01-29 ENCOUNTER — Ambulatory Visit (HOSPITAL_COMMUNITY): Admission: RE | Admit: 2024-01-29 | Source: Ambulatory Visit

## 2024-02-09 ENCOUNTER — Other Ambulatory Visit: Payer: Self-pay | Admitting: *Deleted

## 2024-02-09 MED ORDER — ANASTROZOLE 1 MG PO TABS: 1.0000 mg | ORAL_TABLET | Freq: Every day | ORAL | 3 refills | Status: AC

## 2024-02-09 NOTE — Telephone Encounter (Signed)
 Per last office note, okay to refill anastrozole .

## 2024-03-11 ENCOUNTER — Ambulatory Visit (HOSPITAL_COMMUNITY)
Admission: RE | Admit: 2024-03-11 | Discharge: 2024-03-11 | Disposition: A | Discharge status: Home / Self Care | Source: Ambulatory Visit | Attending: Oncology | Admitting: Oncology

## 2024-03-11 DIAGNOSIS — J439 Emphysema, unspecified: Secondary | ICD-10-CM | POA: Insufficient documentation

## 2024-03-11 DIAGNOSIS — I700 Atherosclerosis of aorta: Secondary | ICD-10-CM | POA: Insufficient documentation

## 2024-03-11 DIAGNOSIS — Z122 Encounter for screening for malignant neoplasm of respiratory organs: Secondary | ICD-10-CM | POA: Insufficient documentation

## 2024-03-11 DIAGNOSIS — C50912 Malignant neoplasm of unspecified site of left female breast: Secondary | ICD-10-CM | POA: Insufficient documentation

## 2024-03-11 DIAGNOSIS — Z87891 Personal history of nicotine dependence: Secondary | ICD-10-CM | POA: Insufficient documentation

## 2024-03-11 DIAGNOSIS — I2510 Atherosclerotic heart disease of native coronary artery without angina pectoris: Secondary | ICD-10-CM | POA: Insufficient documentation

## 2024-03-23 ENCOUNTER — Ambulatory Visit: Payer: Self-pay | Admitting: Oncology

## 2024-03-23 NOTE — Progress Notes (Signed)
 Good morning, I do not see her till March but her CT scan looks pretty good.  Can you let her know?  Delon Hope, AGNP-C Department of Hematology/Oncology [facility]  Phone: [phone]  03/23/2024 7:54 AM

## 2024-03-23 NOTE — Progress Notes (Signed)
 Patient made aware and verbalized understanding.

## 2024-04-19 ENCOUNTER — Encounter: Payer: Self-pay | Admitting: *Deleted

## 2024-06-10 ENCOUNTER — Inpatient Hospital Stay: Payer: Self-pay

## 2024-06-11 ENCOUNTER — Other Ambulatory Visit: Payer: Self-pay

## 2024-06-18 ENCOUNTER — Inpatient Hospital Stay: Payer: Self-pay | Admitting: Oncology

## 2024-06-18 ENCOUNTER — Ambulatory Visit: Payer: Self-pay | Admitting: Oncology

## 2024-06-22 ENCOUNTER — Inpatient Hospital Stay: Payer: Self-pay | Admitting: Oncology

## 2024-06-23 ENCOUNTER — Inpatient Hospital Stay: Payer: Self-pay | Admitting: Oncology
